# Patient Record
Sex: Female | Born: 1965 | Race: White | Hispanic: No | Marital: Married | Smoking: Never smoker
Health system: Southern US, Community
[De-identification: ages and names within clinical notes are randomized; demographics above are authoritative.]

## PROBLEM LIST (undated history)

## (undated) DIAGNOSIS — I10 Essential (primary) hypertension: Secondary | ICD-10-CM

## (undated) DIAGNOSIS — E538 Deficiency of other specified B group vitamins: Secondary | ICD-10-CM

## (undated) DIAGNOSIS — D649 Anemia, unspecified: Secondary | ICD-10-CM

## (undated) HISTORY — PX: SIGMOIDOSCOPY: SHX6686

## (undated) HISTORY — DX: Deficiency of other specified B group vitamins: E53.8

## (undated) HISTORY — PX: ESOPHAGOGASTRODUODENOSCOPY: SHX1529

## (undated) HISTORY — PX: NO PAST SURGERIES: SHX2092

---

## 2007-06-15 ENCOUNTER — Ambulatory Visit: Payer: Self-pay | Admitting: Obstetrics and Gynecology

## 2008-06-21 ENCOUNTER — Ambulatory Visit: Payer: Self-pay | Admitting: Obstetrics and Gynecology

## 2008-07-09 ENCOUNTER — Ambulatory Visit: Payer: Self-pay | Admitting: Obstetrics and Gynecology

## 2009-06-24 ENCOUNTER — Ambulatory Visit: Payer: Self-pay | Admitting: Obstetrics and Gynecology

## 2009-07-29 ENCOUNTER — Ambulatory Visit: Payer: Self-pay | Admitting: Gastroenterology

## 2010-06-26 ENCOUNTER — Ambulatory Visit: Payer: Self-pay | Admitting: Obstetrics and Gynecology

## 2010-07-09 ENCOUNTER — Ambulatory Visit: Payer: Self-pay | Admitting: Obstetrics and Gynecology

## 2010-12-17 ENCOUNTER — Ambulatory Visit: Payer: Self-pay | Admitting: Internal Medicine

## 2010-12-29 ENCOUNTER — Ambulatory Visit: Payer: Self-pay | Admitting: Internal Medicine

## 2011-01-23 ENCOUNTER — Encounter: Payer: Self-pay | Admitting: Internal Medicine

## 2011-04-15 ENCOUNTER — Ambulatory Visit: Payer: Self-pay | Admitting: Sports Medicine

## 2011-06-29 ENCOUNTER — Ambulatory Visit: Payer: Self-pay | Admitting: Obstetrics and Gynecology

## 2011-08-14 ENCOUNTER — Other Ambulatory Visit: Payer: Self-pay | Admitting: Internal Medicine

## 2011-08-14 NOTE — Telephone Encounter (Signed)
[phone] Pt called to check on her bp meds.   [facility] in [place]  Has been faxing over request and will fax again today Pt is completely out of meds for 4 days   Her bp 200/90 today

## 2011-08-14 NOTE — Telephone Encounter (Signed)
Patient is requesting a refill of Bisoprolol-HCTZ 5/6.25, two tabs daily.  [facility] [address] [place] . Is this okay to fill?

## 2011-08-21 MED ORDER — BISOPROLOL-HYDROCHLOROTHIAZIDE 5-6.25 MG PO TABS: ORAL_TABLET | ORAL | Status: DC

## 2011-10-22 LAB — HM DIABETES EYE EXAM

## 2012-03-17 ENCOUNTER — Ambulatory Visit (INDEPENDENT_AMBULATORY_CARE_PROVIDER_SITE_OTHER): Payer: PRIVATE HEALTH INSURANCE | Admitting: Internal Medicine

## 2012-03-17 ENCOUNTER — Ambulatory Visit (INDEPENDENT_AMBULATORY_CARE_PROVIDER_SITE_OTHER)
Admission: RE | Admit: 2012-03-17 | Discharge: 2012-03-17 | Disposition: A | Discharge status: Home / Self Care | Payer: PRIVATE HEALTH INSURANCE | Source: Ambulatory Visit | Attending: Internal Medicine | Admitting: Internal Medicine

## 2012-03-17 ENCOUNTER — Encounter: Payer: Self-pay | Admitting: Internal Medicine

## 2012-03-17 VITALS — BP 128/78 | HR 70 | Temp 98.7°F | Resp 16 | Ht 63.0 in | Wt 149.2 lb

## 2012-03-17 DIAGNOSIS — I10 Essential (primary) hypertension: Secondary | ICD-10-CM

## 2012-03-17 DIAGNOSIS — Z79899 Other long term (current) drug therapy: Secondary | ICD-10-CM

## 2012-03-17 DIAGNOSIS — R0601 Orthopnea: Secondary | ICD-10-CM

## 2012-03-17 DIAGNOSIS — M25559 Pain in unspecified hip: Secondary | ICD-10-CM

## 2012-03-17 DIAGNOSIS — Z13220 Encounter for screening for lipoid disorders: Secondary | ICD-10-CM

## 2012-03-17 DIAGNOSIS — M25551 Pain in right hip: Secondary | ICD-10-CM

## 2012-03-17 DIAGNOSIS — R0609 Other forms of dyspnea: Secondary | ICD-10-CM

## 2012-03-17 LAB — COMPREHENSIVE METABOLIC PANEL
AST: 12 U/L (ref 0–37)
BUN: 7 mg/dL (ref 6–23)
Calcium: 9.3 mg/dL (ref 8.4–10.5)
Chloride: 101 mEq/L (ref 96–112)
Creatinine, Ser: 0.7 mg/dL (ref 0.4–1.2)
Glucose, Bld: 113 mg/dL — ABNORMAL HIGH (ref 70–99)

## 2012-03-17 LAB — LIPID PANEL
Cholesterol: 154 mg/dL (ref 0–200)
LDL Cholesterol: 47 mg/dL (ref 0–99)
Triglycerides: 97 mg/dL (ref 0.0–149.0)
VLDL: 19.4 mg/dL (ref 0.0–40.0)

## 2012-03-17 MED ORDER — BISOPROLOL-HYDROCHLOROTHIAZIDE 5-6.25 MG PO TABS: ORAL_TABLET | ORAL | Status: DC

## 2012-03-17 MED ORDER — HYDROCODONE-ACETAMINOPHEN 10-325 MG PO TABS: 1.0000 | ORAL_TABLET | Freq: Three times a day (TID) | ORAL | Status: DC | PRN

## 2012-03-17 MED ORDER — CYANOCOBALAMIN 1000 MCG/ML IJ SOLN: 1000.0000 ug | INTRAMUSCULAR | Status: DC

## 2012-03-17 NOTE — Progress Notes (Signed)
Patient ID: Nicole Jacobs, female   DOB: [birth year], 46 y.o.   MRN: [id number]    Patient Active Problem List  Diagnosis  . Hip pain, right  . Hypertension    Subjective:  CC:   Chief Complaint  Patient presents with  . Medication Refill    HPI:   Nicole Jacobs is a 46 y.o. female who presents as a new patient to establish primary care with the chief complaint og right sided hip pain.  Had a myelogram by Dr Zachery Dauer, last August which revealed tendonitis and two tears in the muscleand labrum of  the right hip.  She has avoided going back to him because he said she would ultimately need surgery.  Does not want to consider surgery because of her job responsibilities and fear of losing job .  Her pain has returned due to a recent slip while playing with her daughter at the swimming pool.  Pain starts in the right groin, wraps around the hip to her right ischial tuberosity. She has been taking  tylenol and alleve several times daily for several weeks. 2) Progressive dyspnea with exertion.  She has noted increased SOB over the past several months,  NO cough or PND or chest pain .  Not physically active and has gained weight because of her chronic hip pain .    No past medical history on file.  No past surgical history on file.  No family history on file.  History   Social History  . Marital Status: Married    Spouse Name: N/A    Number of Children: N/A  . Years of Education: N/A   Occupational History  . Not on file.   Social History Main Topics  . Smoking status: Never Smoker   . Smokeless tobacco: Never Used  . Alcohol Use: Yes  . Drug Use: No  . Sexually Active: Not on file   Other Topics Concern  . Not on file   Social History Narrative  . No narrative on file         @ALLHX @    Review of Systems:   The remainder of the review of systems was negative except those addressed in the HPI.       Objective:  BP 128/78  Pulse 70  Temp 98.7 F (37.1 C)  (Oral)  Resp 16  Ht 5\' 3"  (1.6 m)  Wt 149 lb 4 oz (67.699 kg)  BMI 26.44 kg/m2  SpO2 97%  LMP 03/13/2012  General appearance: alert, cooperative and appears stated age Ears: normal TM's and external ear canals both ears Throat: lips, mucosa, and tongue normal; teeth and gums normal Neck: no adenopathy, no carotid bruit, supple, symmetrical, trachea midline and thyroid not enlarged, symmetric, no tenderness/mass/nodules Back: symmetric, no curvature. ROM normal. No CVA tenderness. Lungs: clear to auscultation bilaterally Heart: regular rate and rhythm, S1, S2 normal, no murmur, click, rub or gallop Abdomen: soft, non-tender; bowel sounds normal; no masses,  no organomegaly Pulses: 2+ and symmetric Skin: Skin color, texture, turgor normal. No rashes or lesions Lymph nodes: Cervical, supraclavicular, and axillary nodes normal.  Assessment and Plan:  Hip pain, right Recurrent, with prior Sports medicine evaluation leading to MR arthrogram of hip Secondary to partial tear of the gluteus medius tendon, tendonitis of the hamstrings,  And subtle tear of the superior posterior aspect of the acetabular labrum,  By MRI of right hip done with fluoro arthrogram August 2012.  She has re injured the area after a  mild fall recently.  She has been advised to follow up with Richardean Canal.  Vicodin given for pain control.   Hypertension Well controlled on current regimen. Normal fasting lipids and renal function.    Updated Medication List Outpatient Encounter Prescriptions as of 03/17/2012  Medication Sig Dispense Refill  . bisoprolol-hydrochlorothiazide (ZIAC) 5-6.25 MG per tablet Take two tablets by mouth daily  60 tablet  4  . cyanocobalamin (,VITAMIN B-12,) 1000 MCG/ML injection Inject 1 mL (1,000 mcg total) into the muscle every 30 (thirty) days.  10 mL  1  . DISCONTD: bisoprolol-hydrochlorothiazide (ZIAC) 5-6.25 MG per tablet Take two tablets by mouth daily  60 tablet  4  . DISCONTD:  cyanocobalamin (,VITAMIN B-12,) 1000 MCG/ML injection Inject 1,000 mcg into the muscle every 30 (thirty) days.      Marland Kitchen HYDROcodone-acetaminophen (NORCO) 10-325 MG per tablet Take 1 tablet by mouth every 8 (eight) hours as needed for pain.  90 tablet  1     Orders Placed This Encounter  Procedures  . HM MAMMOGRAPHY  . DG Chest 2 View  . HM MAMMOGRAPHY  . HM PAP SMEAR  . Lipid panel  . Comprehensive metabolic panel    No Follow-up on file.

## 2012-03-17 NOTE — Patient Instructions (Addendum)
You can use tylenol (acetominophen) with either Alleve or Advil.     I will prescribe vicodin , short term,   For one month  For severe pain   The maximum daily amount  Of acetominophen is 2000 mg daily ( to prevent liver injury)   x ray at [facility]  follow up depending  on results  Need Dr Tommas Olp notes from 2012

## 2012-03-19 DIAGNOSIS — I10 Essential (primary) hypertension: Secondary | ICD-10-CM | POA: Insufficient documentation

## 2012-03-19 DIAGNOSIS — M25551 Pain in right hip: Secondary | ICD-10-CM | POA: Insufficient documentation

## 2012-03-19 NOTE — Assessment & Plan Note (Addendum)
Well controlled on current regimen. Normal fasting lipids and renal function.

## 2012-03-19 NOTE — Assessment & Plan Note (Signed)
Recurrent, with prior Sports medicine evaluation leading to MR arthrogram of hip Secondary to partial tear of the gluteus medius tendon, tendonitis of the hamstrings,  And subtle tear of the superior posterior aspect of the acetabular labrum,  By MRI of right hip done with fluoro arthrogram August 2012.  She has re injured the area after a mild fall recently.  She has been advised to follow up with Richardean Canal.  Vicodin given for pain control.

## 2012-03-30 ENCOUNTER — Encounter: Payer: Self-pay | Admitting: Internal Medicine

## 2012-04-27 ENCOUNTER — Other Ambulatory Visit: Payer: Self-pay | Admitting: *Deleted

## 2012-04-27 MED ORDER — "SYRINGE/NEEDLE (DISP) 25G X 1"" 3 ML MISC": Status: DC

## 2012-05-30 ENCOUNTER — Telehealth: Payer: Self-pay | Admitting: Internal Medicine

## 2012-05-30 NOTE — Telephone Encounter (Signed)
Pt is calling concerning Rx for Syringes for her B-12's. She is saying that the needles were to big. She was wondering if smaller needles could be called in. She uses [facility]. She is also needing refill for Vicodin for her hip pain.

## 2012-06-02 NOTE — Telephone Encounter (Signed)
Refill request for Vicodin, q 8 hours Last filled- 03/17/12, #90 x 1 Last seen- 03/17/12 Follow up - not noted Please advise refills and I will call pt regarding B12 needles.

## 2012-06-16 ENCOUNTER — Other Ambulatory Visit: Payer: Self-pay

## 2012-06-16 MED ORDER — "SYRINGE/NEEDLE (DISP) 25G X 1"" 3 ML MISC": Status: DC

## 2012-06-16 MED ORDER — HYDROCODONE-ACETAMINOPHEN 10-325 MG PO TABS: 1.0000 | ORAL_TABLET | Freq: Three times a day (TID) | ORAL | Status: DC | PRN

## 2012-06-16 MED ORDER — HYDROCODONE-ACETAMINOPHEN 10-325 MG PO TABS: 1.0000 | ORAL_TABLET | Freq: Three times a day (TID) | ORAL | Status: AC | PRN

## 2012-06-16 NOTE — Telephone Encounter (Signed)
Ok to refill the vicodin  #90   X 1   And b12 needles

## 2012-06-16 NOTE — Telephone Encounter (Signed)
Refill request for B-12 #12 and Hydrocodone Acetaminophen 10-325 #90 1 R sent to [facility].

## 2012-06-29 ENCOUNTER — Ambulatory Visit: Payer: Self-pay | Admitting: Obstetrics and Gynecology

## 2012-10-20 ENCOUNTER — Other Ambulatory Visit: Payer: Self-pay | Admitting: Internal Medicine

## 2012-11-30 ENCOUNTER — Other Ambulatory Visit: Payer: Self-pay | Admitting: Internal Medicine

## 2012-12-12 ENCOUNTER — Encounter: Payer: Self-pay | Admitting: *Deleted

## 2012-12-12 ENCOUNTER — Encounter: Payer: Self-pay | Admitting: Internal Medicine

## 2012-12-12 ENCOUNTER — Ambulatory Visit (INDEPENDENT_AMBULATORY_CARE_PROVIDER_SITE_OTHER): Payer: PRIVATE HEALTH INSURANCE | Admitting: Internal Medicine

## 2012-12-12 VITALS — BP 116/68 | HR 74 | Temp 97.9°F | Resp 16 | Wt 133.5 lb

## 2012-12-12 DIAGNOSIS — M25551 Pain in right hip: Secondary | ICD-10-CM

## 2012-12-12 DIAGNOSIS — I10 Essential (primary) hypertension: Secondary | ICD-10-CM

## 2012-12-12 DIAGNOSIS — G8929 Other chronic pain: Secondary | ICD-10-CM

## 2012-12-12 DIAGNOSIS — M25559 Pain in unspecified hip: Secondary | ICD-10-CM

## 2012-12-12 DIAGNOSIS — Z79899 Other long term (current) drug therapy: Secondary | ICD-10-CM

## 2012-12-12 LAB — COMPREHENSIVE METABOLIC PANEL
ALT: 12 U/L (ref 0–35)
Albumin: 3.6 g/dL (ref 3.5–5.2)
CO2: 29 mEq/L (ref 19–32)
GFR: 77.75 mL/min (ref >60.00)
Glucose, Bld: 128 mg/dL — ABNORMAL HIGH (ref 70–99)
Potassium: 3.5 mEq/L (ref 3.5–5.1)
Sodium: 135 mEq/L (ref 135–145)
Total Bilirubin: 0.6 mg/dL (ref 0.3–1.2)
Total Protein: 7.5 g/dL (ref 6.0–8.3)

## 2012-12-12 MED ORDER — CYANOCOBALAMIN 1000 MCG/ML IJ SOLN: 1000.0000 ug | INTRAMUSCULAR | Status: DC

## 2012-12-12 MED ORDER — HYDROCODONE-ACETAMINOPHEN 10-325 MG PO TABS: 1.0000 | ORAL_TABLET | Freq: Four times a day (QID) | ORAL | Status: DC | PRN

## 2012-12-12 MED ORDER — BISOPROLOL-HYDROCHLOROTHIAZIDE 5-6.25 MG PO TABS: ORAL_TABLET | ORAL | Status: DC

## 2012-12-12 NOTE — Assessment & Plan Note (Signed)
secondary to gluteus medius tear and labral tear.  Discussed at length the need for surgical evaluation to prevent permanent disability.

## 2012-12-12 NOTE — Patient Instructions (Addendum)
I am referring you to Dr Shela Nevin at [facility] to discuss your hip   Refill on the vicodin   Try using colace stool softener on a daily basis  Return in July for your annual physical with fasting labs on the same morning to save trips

## 2012-12-12 NOTE — Progress Notes (Signed)
Patient ID: Nicole Jacobs, female   DOB: [birth year], 47 y.o.   MRN: [id number]  Patient Active Problem List  Diagnosis  . Hip pain, right  . Hypertension    Subjective:  CC:   Chief Complaint  Patient presents with  . Follow-up    Refill meds    HPI:   Dalyce Robbinsis a 47 y.o. female who presents  History reviewed. No pertinent past medical history.  History reviewed. No pertinent past surgical history.     The following portions of the patient's history were reviewed and updated as appropriate: Allergies, current medications, and problem list.    Review of Systems:   12 Pt  review of systems was negative except those addressed in the HPI,     History   Social History  . Marital Status: Married    Spouse Name: N/A    Number of Children: N/A  . Years of Education: N/A   Occupational History  . Not on file.   Social History Main Topics  . Smoking status: Never Smoker   . Smokeless tobacco: Never Used  . Alcohol Use: Yes  . Drug Use: No  . Sexually Active: Not on file   Other Topics Concern  . Not on file   Social History Narrative  . No narrative on file    Objective:  BP 116/68  Pulse 74  Temp(Src) 97.9 F (36.6 C) (Oral)  Resp 16  Wt 133 lb 8 oz (60.555 kg)  BMI 23.65 kg/m2  SpO2 98%  LMP 11/07/2012  General appearance: alert, cooperative and appears stated age Back: symmetric, no curvature. ROM normal. No CVA tenderness. Lungs: clear to auscultation bilaterally Heart: regular rate and rhythm, S1, S2 normal, no murmur, click, rub or gallop Abdomen: soft, non-tender; bowel sounds normal; no masses,  no organomegaly Pulses: 2+ and symmetric Skin: Skin color, texture, turgor normal. No rashes or lesions Lymph nodes: Cervical, supraclavicular, and axillary nodes normal. MSK: decreased ROM right hip secondary to pain ,  Hip flexion 4/5.   Assessment and Plan:  Hip pain, right secondary to gluteus medius tear and labral tear.   Discussed at length the need for surgical evaluation to prevent permanent disability.   Hypertension Well controlled on current regimen. Renal function stable, no changes today.   Updated Medication List Outpatient Encounter Prescriptions as of 12/12/2012  Medication Sig Dispense Refill  . bisoprolol-hydrochlorothiazide (ZIAC) 5-6.25 MG per tablet TAKE 2 TABLETS BY MOUTH DAILY  60 tablet  6  . cyanocobalamin (,VITAMIN B-12,) 1000 MCG/ML injection Inject 1 mL (1,000 mcg total) into the muscle every 30 (thirty) days.  10 mL  1  . HYDROcodone-acetaminophen (NORCO) 10-325 MG per tablet Take 1 tablet by mouth every 6 (six) hours as needed for pain.  90 tablet  3  . SYRINGE-NEEDLE, DISP, 3 ML (BD ECLIPSE SYRINGE) 25G X 1" 3 ML MISC Use to inject b12 every 30 days  12 each  2  . [DISCONTINUED] bisoprolol-hydrochlorothiazide (ZIAC) 5-6.25 MG per tablet TAKE 2 TABLETS BY MOUTH DAILY  60 tablet  0  . [DISCONTINUED] cyanocobalamin (,VITAMIN B-12,) 1000 MCG/ML injection Inject 1 mL (1,000 mcg total) into the muscle every 30 (thirty) days.  10 mL  1  . [DISCONTINUED] HYDROcodone-acetaminophen (NORCO) 10-325 MG per tablet Take 1 tablet by mouth every 6 (six) hours as needed for pain.       No facility-administered encounter medications on file as of 12/12/2012.     Orders Placed This Encounter  Procedures  .  Comprehensive metabolic panel  . Ambulatory referral to Orthopedic Surgery    No Follow-up on file.

## 2012-12-12 NOTE — Assessment & Plan Note (Signed)
Well controlled on current regimen. Renal function stable, no changes today. 

## 2013-12-11 ENCOUNTER — Encounter: Payer: Self-pay | Admitting: Internal Medicine

## 2013-12-11 ENCOUNTER — Encounter (INDEPENDENT_AMBULATORY_CARE_PROVIDER_SITE_OTHER): Payer: Self-pay

## 2013-12-11 ENCOUNTER — Ambulatory Visit (INDEPENDENT_AMBULATORY_CARE_PROVIDER_SITE_OTHER): Payer: Medicaid Other | Admitting: Internal Medicine

## 2013-12-11 VITALS — BP 142/74 | HR 84 | Temp 98.4°F | Resp 16 | Ht 62.0 in | Wt 116.0 lb

## 2013-12-11 DIAGNOSIS — R5381 Other malaise: Secondary | ICD-10-CM

## 2013-12-11 DIAGNOSIS — M25552 Pain in left hip: Secondary | ICD-10-CM

## 2013-12-11 DIAGNOSIS — R5383 Other fatigue: Secondary | ICD-10-CM

## 2013-12-11 DIAGNOSIS — M545 Low back pain, unspecified: Secondary | ICD-10-CM

## 2013-12-11 DIAGNOSIS — I10 Essential (primary) hypertension: Secondary | ICD-10-CM

## 2013-12-11 DIAGNOSIS — F5105 Insomnia due to other mental disorder: Secondary | ICD-10-CM

## 2013-12-11 DIAGNOSIS — M25559 Pain in unspecified hip: Secondary | ICD-10-CM

## 2013-12-11 DIAGNOSIS — M5430 Sciatica, unspecified side: Secondary | ICD-10-CM

## 2013-12-11 DIAGNOSIS — F411 Generalized anxiety disorder: Secondary | ICD-10-CM

## 2013-12-11 DIAGNOSIS — F489 Nonpsychotic mental disorder, unspecified: Secondary | ICD-10-CM

## 2013-12-11 DIAGNOSIS — F419 Anxiety disorder, unspecified: Secondary | ICD-10-CM

## 2013-12-11 DIAGNOSIS — M25551 Pain in right hip: Secondary | ICD-10-CM

## 2013-12-11 DIAGNOSIS — R634 Abnormal weight loss: Secondary | ICD-10-CM

## 2013-12-11 LAB — VITAMIN B12: Vitamin B-12: 260 pg/mL (ref 211–911)

## 2013-12-11 LAB — CBC WITH DIFFERENTIAL/PLATELET
BASOS PCT: 0.9 % (ref 0.0–3.0)
Basophils Absolute: 0 10*3/uL (ref 0.0–0.1)
EOS ABS: 0.1 10*3/uL (ref 0.0–0.7)
EOS PCT: 2.1 % (ref 0.0–5.0)
HCT: 37.5 % (ref 36.0–46.0)
HEMOGLOBIN: 12.6 g/dL (ref 12.0–15.0)
LYMPHS PCT: 44.5 % (ref 12.0–46.0)
Lymphs Abs: 2.2 10*3/uL (ref 0.7–4.0)
MCHC: 33.7 g/dL (ref 30.0–36.0)
MCV: 89.7 fl (ref 78.0–100.0)
MONOS PCT: 9.7 % (ref 3.0–12.0)
Monocytes Absolute: 0.5 10*3/uL (ref 0.1–1.0)
NEUTROS ABS: 2.1 10*3/uL (ref 1.4–7.7)
Neutrophils Relative %: 42.8 % — ABNORMAL LOW (ref 43.0–77.0)
Platelets: 325 10*3/uL (ref 150.0–400.0)
RBC: 4.18 Mil/uL (ref 3.87–5.11)
RDW: 12.9 % (ref 11.5–14.6)
WBC: 4.9 10*3/uL (ref 4.5–10.5)

## 2013-12-11 LAB — COMPREHENSIVE METABOLIC PANEL
ALBUMIN: 3.9 g/dL (ref 3.5–5.2)
ALT: 14 U/L (ref 0–35)
AST: 15 U/L (ref 0–37)
Alkaline Phosphatase: 59 U/L (ref 39–117)
BUN: 14 mg/dL (ref 6–23)
CALCIUM: 9.3 mg/dL (ref 8.4–10.5)
CHLORIDE: 104 meq/L (ref 96–112)
CO2: 28 meq/L (ref 19–32)
Creatinine, Ser: 0.7 mg/dL (ref 0.4–1.2)
GFR: 97.14 mL/min (ref >60.00)
Glucose, Bld: 83 mg/dL (ref 70–99)
POTASSIUM: 4.1 meq/L (ref 3.5–5.1)
SODIUM: 140 meq/L (ref 135–145)
TOTAL PROTEIN: 6.7 g/dL (ref 6.0–8.3)
Total Bilirubin: 0.9 mg/dL (ref 0.3–1.2)

## 2013-12-11 LAB — TSH: TSH: 0.84 u[IU]/mL (ref 0.35–5.50)

## 2013-12-11 LAB — MAGNESIUM: Magnesium: 1.9 mg/dL (ref 1.5–2.5)

## 2013-12-11 MED ORDER — TRAMADOL HCL 50 MG PO TABS: 50.0000 mg | ORAL_TABLET | Freq: Three times a day (TID) | ORAL | Status: DC | PRN

## 2013-12-11 MED ORDER — "SYRINGE/NEEDLE (DISP) 25G X 1"" 3 ML MISC": Status: DC

## 2013-12-11 MED ORDER — BISOPROLOL-HYDROCHLOROTHIAZIDE 5-6.25 MG PO TABS: ORAL_TABLET | ORAL | Status: DC

## 2013-12-11 MED ORDER — TRAZODONE HCL 50 MG PO TABS: 25.0000 mg | ORAL_TABLET | Freq: Every evening | ORAL | Status: DC | PRN

## 2013-12-11 MED ORDER — CYANOCOBALAMIN 1000 MCG/ML IJ SOLN: 1000.0000 ug | INTRAMUSCULAR | Status: DC

## 2013-12-11 NOTE — Progress Notes (Signed)
Pre-visit discussion using our clinic review tool. No additional management support is needed unless otherwise documented below in the visit note.  

## 2013-12-11 NOTE — Progress Notes (Signed)
Patient ID: Nicole Jacobs, female   DOB: 13-Dec-1965, 48 y.o.   MRN: [id number]   Patient Active Problem List   Diagnosis Date Noted  . Loss of weight 12/12/2013  . 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 Lumbago 12/12/2013  . Hip pain, right 03/19/2012  . Hypertension 03/19/2012    Subjective:  CC:   Chief Complaint  Patient presents with  . Follow-up    Medication refills    HPI:   Nicole Jacobs is a 48 y.o. female who presents for Follow up on hypertension and hip pain.    Last seen one year ago.  IN the interim her mother died of lung Ca in 06-15-2023 after being diagnosed in June.  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 working part time currently. Sleep latency of  30 to 45 min, excessive worrying,    Has had an unintentional wt loss of 33 lbs since July 2013.  Cites stress as trigger  Denies abd pain ,  No change in stools.   Screening : last mammo 2014,  PAP smears annually by [facility]) bc of abnormal ones.  No history of black stools,  Had BRBPR in the past and sigmoidoscopy was done by [facility] last year. .    Both hips hurting now both day and night.  Aching pain,  Not severe.  Taking Goody's powders on a prn basis averaging several times daily along with advil or alleve.   Both legs,.  Right hip was x rayed a while ago at [facility] and MRI, was normal.  Had a tear in the labrum.  Surgery was recommended but she couldn't be out of work.  Now she is receiving Medicaid and not sure she could afford it.   Thinks she may have firbomyalgia,  Stays tired all the time   Ran out of B12 last month,  Not taking any oral supplements.  History: Persistent rip  hip pain since August 2012.  He was seen by Dr. Zachery Dauer in 2012 and sent for PT but  had an  MRI repeated with contrast due to leg continually giving way  ,  It showed two tears  On  the the labrum and one in the gluteus medius muscle.  She deferred surgical evaluation because of work issues.  She cannot sleep on right side,  And her pain is aggravated by walking.  She feels that she may have reinjured it last year during a minor fall . The original injury occurred at work but it was after hours  And caused by her slipping on the wet floor and doing a split.  She did not report it because she wasn't supposed to be working at night .     No past medical history on file.  No past surgical history on file.     The following portions of the patient's history were reviewed and updated as appropriate: Allergies, current medications, and problem list.    Review of Systems:   Patient denies headache, fevers, malaise, unintentional weight loss, skin rash, eye pain, sinus congestion and sinus pain, sore throat, dysphagia,  hemoptysis , cough, dyspnea, wheezing, chest pain, palpitations, orthopnea, edema, abdominal pain, nausea, melena, diarrhea, constipation, flank pain, dysuria, hematuria, urinary  Frequency, nocturia, numbness, tingling, seizures,  Focal weakness, Loss of consciousness,  Tremor, insomnia, depression, anxiety, and suicidal ideation.  History   Social History  . Marital Status: Married    Spouse Name: N/A    Number of Children: N/A  . Years of Education: N/A   Occupational History  . Not on file.   Social History Main Topics  . Smoking status: Never Smoker   . Smokeless tobacco: Never Used  . Alcohol Use: Yes  . Drug Use: No  . Sexual Activity: Not on file   Other Topics Concern  . Not on file   Social History Narrative  . No narrative on file    Objective:  Filed Vitals:   12/11/13 0838  BP: 142/74  Pulse: 84  Temp: 98.4 F (36.9 C)  Resp: 16     General appearance: alert, cooperative and appears stated age Ears: normal TM's and external ear canals both ears Throat: lips, mucosa, and tongue normal; teeth and gums normal Neck:  no adenopathy, no carotid bruit, supple, symmetrical, trachea midline and thyroid not enlarged, symmetric, no tenderness/mass/nodules Back: symmetric, no curvature. ROM normal. No CVA tenderness. Lungs: clear to auscultation bilaterally Heart: regular rate and rhythm, S1, S2 normal, no murmur, click, rub or gallop Abdomen: soft, non-tender; bowel sounds normal; no masses,  no organomegaly Pulses: 2+ and symmetric Skin: Skin color, texture, turgor normal. No rashes or lesions Lymph nodes: Cervical, supraclavicular, and axillary nodes normal. MSK: no vertebral tenderness.  Hip ROM limited by pain   Assessment and Plan:  Loss of weight Ruling out thyroid and malignancy before attributing to stress.  Records requested of [facility] by [facility].  Needs to do FOBT  Hip pain, right secondary to gluteus medius tear and labral tea diagnosed by repeat MRI in 2014 after eval by Richard[facility] Canal[facility]. Now having bilateral hip pain .   X rays of left hip and lower back ordered.  Advised to stop using Goody's powders due ot risk of GI bleed. Rx for tramadol given and aleve. Discussed at length the need for surgical evaluation to prevent permanent disability.     Insomnia secondary to anxiety Trial of trazodone  Lumbago Patient previously taking vicodin in 2012,  MRI was normal of lumbar spine.  Will avoid narcotics for management of pain and refer to Pain clinic if nonsurgical treatment indicated.   Hypertension Resume prior medication.  Renal function normal.  Lab Results  Component Value Date   CREATININE 0.7 12/11/2013   Lab Results  Component Value Date   NA 140 12/11/2013   K 4.1 12/11/2013   CL 104 12/11/2013   CO2 28 12/11/2013    A total of 40 minutes was spent with patient more than half of which was spent in counseling, reviewing records from other prviders and coordination of care.   Updated Medication List Outpatient Encounter Prescriptions as of 12/11/2013  Medication Sig  .  bisoprolol-hydrochlorothiazide (ZIAC) 5-6.25 MG per tablet TAKE 2 TABLETS BY MOUTH DAILY  . cyanocobalamin (,VITAMIN B-12,) 1000 MCG/ML injection Inject 1 mL (1,000 mcg total) into the muscle every 30 (thirty) days.  Marland Kitchen. HYDROcodone-acetaminophen (NORCO) 10-325 MG per tablet Take 1 tablet by mouth every 6 (six) hours as needed for pain.  Marland Kitchen. SYRINGE-NEEDLE, DISP, 3 ML (BD ECLIPSE SYRINGE) 25G X 1" 3 ML MISC Use to inject b12 every 30 days  . [DISCONTINUED] bisoprolol-hydrochlorothiazide (ZIAC) 5-6.25 MG per tablet TAKE 2 TABLETS BY MOUTH DAILY  . [DISCONTINUED] cyanocobalamin (,VITAMIN B-12,) 1000 MCG/ML injection Inject 1 mL (1,000 mcg total) into the muscle every 30 (thirty) days.  . [DISCONTINUED] SYRINGE-NEEDLE,  DISP, 3 ML (BD ECLIPSE SYRINGE) 25G X 1" 3 ML MISC Use to inject b12 every 30 days  . traMADol (ULTRAM) 50 MG tablet Take 1 tablet (50 mg total) by mouth every 8 (eight) hours as needed.  . traZODone (DESYREL) 50 MG tablet Take 0.5-1 tablets (25-50 mg total) by mouth at bedtime as needed for sleep.     Orders Placed This Encounter  Procedures  . Fecal occult blood, imunochemical  . DG Hip Complete Left  . DG Lumbar Spine Complete  . CBC with Differential  . Folate RBC  . Vitamin B12  . TSH  . Comprehensive metabolic panel  . Magnesium    Return in about 4 weeks (around 01/08/2014).

## 2013-12-11 NOTE — Patient Instructions (Signed)
Your anxiety and insomnia need treatment.  I am starting you on trazodone to take at night  Your daily use of Goody's powders puts you at high risk  For a bleeding gastric ulcer  Please stop using it and use tramadol every 6 hours as needed for pain,  Along with aleve twice daily  [facility] ryas [facility]  left hip and lower back [facility]   Return in one month

## 2013-12-12 DIAGNOSIS — F5105 Insomnia due to other mental disorder: Secondary | ICD-10-CM

## 2013-12-12 DIAGNOSIS — F419 Anxiety disorder, unspecified: Secondary | ICD-10-CM | POA: Insufficient documentation

## 2013-12-12 DIAGNOSIS — M545 Low back pain, unspecified: Secondary | ICD-10-CM

## 2013-12-12 DIAGNOSIS — R634 Abnormal weight loss: Secondary | ICD-10-CM | POA: Insufficient documentation

## 2013-12-12 HISTORY — DX: Low back pain, unspecified: M54.50

## 2013-12-12 LAB — FOLATE RBC: RBC Folate: 521 ng/mL (ref >280)

## 2013-12-12 NOTE — Assessment & Plan Note (Addendum)
Resume prior medication.  Renal function normal.  Lab Results  Component Value Date   CREATININE 0.7 12/11/2013   Lab Results  Component Value Date   NA 140 12/11/2013   K 4.1 12/11/2013   CL 104 12/11/2013   CO2 28 12/11/2013

## 2013-12-12 NOTE — Assessment & Plan Note (Signed)
Ruling out thyroid and malignancy before attributing to stress.  Records requested of GI evaluation by [facility].  Needs to do FOBT

## 2013-12-12 NOTE — Assessment & Plan Note (Signed)
Trial of trazodone. 

## 2013-12-12 NOTE — Assessment & Plan Note (Signed)
Patient previously taking vicodin in 2012,  MRI was normal of lumbar spine.  Will avoid narcotics for management of pain and refer to Pain clinic if nonsurgical treatment indicated.

## 2013-12-12 NOTE — Assessment & Plan Note (Addendum)
secondary to gluteus medius tear and labral tea diagnosed by repeat MRI in 2014 after eval by [facility] Canal[facility]. Now having bilateral hip pain .   X rays of left hip and lower back ordered.  Advised to stop using Goody's powders due ot risk of GI bleed. Rx for tramadol given and aleve. Discussed at length the need for surgical evaluation to prevent permanent disability.

## 2013-12-13 ENCOUNTER — Encounter: Payer: Self-pay | Admitting: *Deleted

## 2013-12-15 ENCOUNTER — Ambulatory Visit: Payer: Self-pay | Admitting: Internal Medicine

## 2013-12-19 ENCOUNTER — Telehealth: Payer: Self-pay | Admitting: Internal Medicine

## 2013-12-19 NOTE — Telephone Encounter (Signed)
Her hip films are fine  lumbar spine films suggest degenerative disk disease

## 2013-12-20 ENCOUNTER — Other Ambulatory Visit (INDEPENDENT_AMBULATORY_CARE_PROVIDER_SITE_OTHER): Payer: Medicaid Other

## 2013-12-20 DIAGNOSIS — R634 Abnormal weight loss: Secondary | ICD-10-CM

## 2013-12-20 LAB — FECAL OCCULT BLOOD, IMMUNOCHEMICAL: Fecal Occult Bld: NEGATIVE

## 2013-12-20 NOTE — Telephone Encounter (Signed)
Left message for patient to return call.

## 2013-12-20 NOTE — Telephone Encounter (Signed)
Patient notified and voiced understanding.

## 2013-12-21 ENCOUNTER — Encounter: Payer: Self-pay | Admitting: *Deleted

## 2014-01-10 ENCOUNTER — Encounter: Payer: Self-pay | Admitting: Internal Medicine

## 2014-01-10 ENCOUNTER — Ambulatory Visit: Payer: Self-pay | Admitting: Internal Medicine

## 2014-01-10 ENCOUNTER — Ambulatory Visit (INDEPENDENT_AMBULATORY_CARE_PROVIDER_SITE_OTHER): Payer: Medicaid Other | Admitting: Internal Medicine

## 2014-01-10 ENCOUNTER — Encounter (INDEPENDENT_AMBULATORY_CARE_PROVIDER_SITE_OTHER): Payer: Self-pay

## 2014-01-10 VITALS — BP 114/60 | HR 89 | Temp 98.5°F | Resp 16 | Ht 62.0 in | Wt 116.0 lb

## 2014-01-10 DIAGNOSIS — M25551 Pain in right hip: Secondary | ICD-10-CM

## 2014-01-10 DIAGNOSIS — M25559 Pain in unspecified hip: Secondary | ICD-10-CM

## 2014-01-10 DIAGNOSIS — M545 Low back pain, unspecified: Secondary | ICD-10-CM

## 2014-01-10 DIAGNOSIS — M542 Cervicalgia: Secondary | ICD-10-CM

## 2014-01-10 DIAGNOSIS — M5432 Sciatica, left side: Secondary | ICD-10-CM

## 2014-01-10 DIAGNOSIS — M5430 Sciatica, unspecified side: Secondary | ICD-10-CM

## 2014-01-10 MED ORDER — PREDNISONE (PAK) 10 MG PO TABS: ORAL_TABLET | ORAL | Status: DC

## 2014-01-10 MED ORDER — HYDROCODONE-ACETAMINOPHEN 10-325 MG PO TABS: 1.0000 | ORAL_TABLET | Freq: Four times a day (QID) | ORAL | Status: DC | PRN

## 2014-01-10 NOTE — Progress Notes (Signed)
Patient ID: Nicole Jacobs, female   DOB: 01-Jun-1966, 48 y.o.   MRN: [id number]  Patient Active Problem List   Diagnosis Date Noted  . Neck pain on right side 01/11/2014  . Loss of weight 12/12/2013  . 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 Lumbago 12/12/2013  . Hip pain, right 03/19/2012  . Hypertension 03/19/2012    Subjective:  CC:   Chief Complaint  Patient presents with  . Follow-up    hip pain for left hip and for results    HPI:   Nicole Jacobs is a 48 y.o. female who presents for Follow up on chronic back and hip pain. Her left hip is now bothering her. Reports that it goes to sleep after sitting for a while.  Also reporting that her neck is popping,  Aches and the pain radiates to right shoulder.   Tramadol not helping with pain in hips.  Has not consented to referral to orthopedic surgeon yet. Still working part time.  Stocks shelves  No heavy lifting but repetitive lifting still part of her daily activities.   Recent laceration to forehead now resolved. Hit head on car door gashed forehead,  Big gooseegg and bruising,  Now resolved.    No past medical history on file.  No past surgical history on file.     The following portions of the patient's history were reviewed and updated as appropriate: Allergies, current medications, and problem list.    Review of Systems:   Patient denies headache, fevers, malaise, unintentional weight loss, skin rash, eye pain, sinus congestion and sinus pain, sore throat, dysphagia,  hemoptysis , cough, dyspnea, wheezing, chest pain, palpitations, orthopnea, edema, abdominal pain, nausea, melena, diarrhea, constipation, flank pain, dysuria, hematuria, urinary  Frequency, nocturia, numbness, tingling, seizures,  Focal weakness, Loss of consciousness,  Tremor, insomnia, depression, anxiety, and suicidal ideation.     History   Social History  . Marital Status: Married    Spouse Name: N/A    Number of Children: N/A  .  Years of Education: N/A   Occupational History  . Not on file.   Social History Main Topics  . Smoking status: Never Smoker   . Smokeless tobacco: Never Used  . Alcohol Use: Yes  . Drug Use: No  . Sexual Activity: Not on file   Other Topics Concern  . Not on file   Social History Narrative  . No narrative on file    Objective:  Filed Vitals:   01/10/14 1441  BP: 114/60  Pulse: 89  Temp: 98.5 F (36.9 C)  Resp: 16     General appearance: alert, cooperative and appears stated age Ears: normal TM's and external ear canals both ears Throat: lips, mucosa, and tongue normal; teeth and gums normal Neck: no adenopathy, no carotid bruit, supple, symmetrical, trachea midline and thyroid not enlarged, symmetric, no tenderness/mass/nodules Back: symmetric, no curvature. ROM normal. No CVA tenderness. Lungs: clear to auscultation bilaterally Heart: regular rate and rhythm, S1, S2 normal, no murmur, click, rub or gallop Abdomen: soft, non-tender; bowel sounds normal; no masses,  no organomegaly Pulses: 2+ and symmetric Skin: Skin color, texture, turgor normal. No rashes or lesions Lymph nodes: Cervical, supraclavicular, and axillary nodes normal.  Assessment and Plan:  Neck pain on right side Suspect cervical  spine stenosis.  Neuro exam is normal.Predniosne taper trial.   No intervention recommended at this time.    Hip pain, right secondary to gluteus medius tear and labral tear diagnosed by repeat  MRI in 2014 after eval by Richardean CanalKen Barnes. Now having bilateral hip pain , not relieved with tramadol. .   Referral to orthopedics advised. Short term vicodin use.   Lumbago Now with radicular pine.  Mri lumbar spine ordered.  Prednisone taper and vicodin    Updated Medication List Outpatient Encounter Prescriptions as of 01/10/2014  Medication Sig  . bisoprolol-hydrochlorothiazide (ZIAC) 5-6.25 MG per tablet TAKE 2 TABLETS BY MOUTH DAILY  . cyanocobalamin (,VITAMIN B-12,) 1000  MCG/ML injection Inject 1 mL (1,000 mcg total) into the muscle every 30 (thirty) days.  . SYRINGE-NEEDLE, DISP, 3 ML (BD ECLIPSE SYRINGE) 25G X 1" 3 ML MISC Use to inject b12 every 30 days  . traZODone (DESYREL) 50 MG tablet Take 0.5-1 tablets (25-50 mg total) by mouth at bedtime as needed for sleep.  Marland Kitchen. HYDROcodone-acetaminophen (NORCO) 10-325 MG per tablet Take 1 tablet by mouth every 6 (six) hours as needed.  . predniSONE (STERAPRED UNI-PAK) 10 MG tablet 6 tablets on Day 1 , then reduce by 1 tablet daily until gone  . traMADol (ULTRAM) 50 MG tablet Take 1 tablet (50 mg total) by mouth every 8 (eight) hours as needed.  . [DISCONTINUED] HYDROcodone-acetaminophen (NORCO) 10-325 MG per tablet Take 1 tablet by mouth every 6 (six) hours as needed for pain.     Orders Placed This Encounter  Procedures  . MR Lumbar Spine Wo Contrast  . DG Cervical Spine Complete  . AMB referral to orthopedics    No Follow-up on file.

## 2014-01-10 NOTE — Progress Notes (Signed)
Pre-visit discussion using our clinic review tool. No additional management support is needed unless otherwise documented below in the visit note.  

## 2014-01-10 NOTE — Patient Instructions (Signed)
I am prescribing a prednisone taper for you to take for the next  6 days for inflammation of your neck  DO NOT TAKE GOODY'S POWDERS WHEN YOU ARE TAKING THE PREDNISONE  I have ordered an MRI of lumbar spine to investigate you left leg pain and numbness  You can use the vicodin instead of tramadol for pain but I will not continually refill because yo need to see a suegeon

## 2014-01-11 ENCOUNTER — Telehealth: Payer: Self-pay | Admitting: Internal Medicine

## 2014-01-11 DIAGNOSIS — M542 Cervicalgia: Secondary | ICD-10-CM | POA: Insufficient documentation

## 2014-01-11 NOTE — Telephone Encounter (Signed)
xrays of neck received .  Her Plain films may 2015 notes mild DD changes at C5-6 with mild left foraminal narrowing. This could develop into spinal stenosis with repeated abuse of neck and shoulders.

## 2014-01-11 NOTE — Telephone Encounter (Signed)
Patient notified

## 2014-01-13 NOTE — Assessment & Plan Note (Addendum)
Suspect cervical  spine stenosis.  Neuro exam is normal.Predniosne taper trial.   No intervention recommended at this time.

## 2014-01-13 NOTE — Assessment & Plan Note (Addendum)
secondary to gluteus medius tear and labral tear diagnosed by repeat MRI in 2014 after eval by Richardean Canal. Now having bilateral hip pain , not relieved with tramadol. .   Referral to orthopedics advised. Short term vicodin use.

## 2014-01-13 NOTE — Assessment & Plan Note (Signed)
Now with radicular pine.  Mri lumbar spine ordered.  Prednisone taper and vicodin

## 2014-01-16 ENCOUNTER — Encounter: Payer: Self-pay | Admitting: Internal Medicine

## 2014-01-22 ENCOUNTER — Telehealth: Payer: Self-pay | Admitting: Internal Medicine

## 2014-01-22 DIAGNOSIS — M545 Low back pain, unspecified: Secondary | ICD-10-CM

## 2014-01-22 NOTE — Assessment & Plan Note (Addendum)
Her insurance denied coverage for repeat  lumbar MRI .  If her pain does not improve with prednisoen She will have to accept referral to PT , which I have ordered

## 2014-01-22 NOTE — Telephone Encounter (Signed)
Referral already in place

## 2014-01-22 NOTE — Telephone Encounter (Signed)
Her insurance denied coverage for repeat  lumbar MRI .  If her pain does not improve with prednisoen She will have to accept referral to PT , which I have ordered  

## 2014-01-22 NOTE — Telephone Encounter (Signed)
Patient called in checking the status of the MRI I told her that her insurance had denied coverage for the repeat MRI. She states she does have pain and is willing to proceed with the referral for PT.

## 2014-01-24 ENCOUNTER — Encounter: Payer: Self-pay | Admitting: Internal Medicine

## 2015-01-04 ENCOUNTER — Other Ambulatory Visit: Payer: Self-pay | Admitting: Internal Medicine

## 2015-01-04 DIAGNOSIS — R5383 Other fatigue: Secondary | ICD-10-CM

## 2015-01-04 DIAGNOSIS — E538 Deficiency of other specified B group vitamins: Secondary | ICD-10-CM

## 2015-01-04 DIAGNOSIS — E785 Hyperlipidemia, unspecified: Secondary | ICD-10-CM

## 2015-01-04 NOTE — Telephone Encounter (Signed)
Last OV 01/10/14 ok to fill?

## 2015-01-04 NOTE — Telephone Encounter (Signed)
Spoke to pt, Lab appt and OV scheduled.

## 2015-01-04 NOTE — Telephone Encounter (Signed)
Refill one 30 days only.  Has not been seen in one year so he needs fasting labs and a  30 minute visit.

## 2015-01-11 ENCOUNTER — Ambulatory Visit: Payer: Medicaid Other | Admitting: Internal Medicine

## 2015-01-18 ENCOUNTER — Other Ambulatory Visit: Payer: Medicaid Other

## 2015-01-24 ENCOUNTER — Ambulatory Visit (INDEPENDENT_AMBULATORY_CARE_PROVIDER_SITE_OTHER): Payer: Self-pay | Admitting: Internal Medicine

## 2015-01-24 DIAGNOSIS — I10 Essential (primary) hypertension: Secondary | ICD-10-CM

## 2015-01-26 NOTE — Assessment & Plan Note (Signed)
Patient failed to keep scheduled appointment and will be charged a no show fee.   

## 2015-01-26 NOTE — Progress Notes (Signed)
Subjective:  Patient ID: Nicole Jacobs, female    DOB: December 02, 1965  Age: 49 y.o. MRN: [id number]  CC: The encounter diagnosis was Essential hypertension.  HPI Nicole Jacobs  failed to keep scheduled 30 minute appointment, has not been seen in a year, and will be charged a no show fee.       Outpatient Prescriptions Prior to Visit  Medication Sig Dispense Refill  . bisoprolol-hydrochlorothiazide (ZIAC) 5-6.25 MG per tablet TAKE 2 TABLETS BY MOUTH DAILY 60 tablet 0  . cyanocobalamin (,VITAMIN B-12,) 1000 MCG/ML injection Inject 1 mL (1,000 mcg total) into the muscle every 30 (thirty) days. 10 mL 1  . HYDROcodone-acetaminophen (NORCO) 10-325 MG per tablet Take 1 tablet by mouth every 6 (six) hours as needed. 90 tablet 0  . predniSONE (STERAPRED UNI-PAK) 10 MG tablet 6 tablets on Day 1 , then reduce by 1 tablet daily until gone 21 tablet 0  . SYRINGE-NEEDLE, DISP, 3 ML (BD ECLIPSE SYRINGE) 25G X 1" 3 ML MISC Use to inject b12 every 30 days 12 each 2  . traMADol (ULTRAM) 50 MG tablet Take 1 tablet (50 mg total) by mouth every 8 (eight) hours as needed. 90 tablet 3  . traZODone (DESYREL) 50 MG tablet Take 0.5-1 tablets (25-50 mg total) by mouth at bedtime as needed for sleep. 30 tablet 3   No facility-administered medications prior to visit.    Review of Systems;  Patient denies headache, fevers, malaise, unintentional weight loss, skin rash, eye pain, sinus congestion and sinus pain, sore throat, dysphagia,  hemoptysis , cough, dyspnea, wheezing, chest pain, palpitations, orthopnea, edema, abdominal pain, nausea, melena, diarrhea, constipation, flank pain, dysuria, hematuria, urinary  Frequency, nocturia, numbness, tingling, seizures,  Focal weakness, Loss of consciousness,  Tremor, insomnia, depression, anxiety, and suicidal ideation.      Objective:  There were no vitals taken for this visit.  BP Readings from Last 3 Encounters:  01/10/14 114/60  12/11/13 142/74  12/12/12  116/68    Wt Readings from Last 3 Encounters:  01/10/14 116 lb (52.617 kg)  12/11/13 116 lb (52.617 kg)  12/12/12 133 lb 8 oz (60.555 kg)    General appearance: alert, cooperative and appears stated age Ears: normal TM's and external ear canals both ears Throat: lips, mucosa, and tongue normal; teeth and gums normal Neck: no adenopathy, no carotid bruit, supple, symmetrical, trachea midline and thyroid not enlarged, symmetric, no tenderness/mass/nodules Back: symmetric, no curvature. ROM normal. No CVA tenderness. Lungs: clear to auscultation bilaterally Heart: regular rate and rhythm, S1, S2 normal, no murmur, click, rub or gallop Abdomen: soft, non-tender; bowel sounds normal; no masses,  no organomegaly Pulses: 2+ and symmetric Skin: Skin color, texture, turgor normal. No rashes or lesions Lymph nodes: Cervical, supraclavicular, and axillary nodes normal.  No results found for: HGBA1C  Lab Results  Component Value Date   CREATININE 0.7 12/11/2013   CREATININE 0.8 12/12/2012   CREATININE 0.7 03/17/2012    Lab Results  Component Value Date   WBC 4.9 12/11/2013   HGB 12.6 12/11/2013   HCT 37.5 12/11/2013   PLT 325.0 12/11/2013   GLUCOSE 83 12/11/2013   CHOL 154 03/17/2012   TRIG 97.0 03/17/2012   HDL 87.20 03/17/2012   LDLCALC 47 03/17/2012   ALT 14 12/11/2013   AST 15 12/11/2013   NA 140 12/11/2013   K 4.1 12/11/2013   CL 104 12/11/2013   CREATININE 0.7 12/11/2013   BUN 14 12/11/2013   CO2 28  12/11/2013   TSH 0.84 12/11/2013    No results found.  Assessment & Plan:   Problem List Items Addressed This Visit    Hypertension - Primary    Patient failed to keep scheduled appointment and will be charged a no show fee.           I have discontinued Nicole Jacobs's traZODone, traMADol, cyanocobalamin, SYRINGE-NEEDLE (DISP) 3 ML, HYDROcodone-acetaminophen, predniSONE, and bisoprolol-hydrochlorothiazide.  No orders of the defined types were placed in this  encounter.    Medications Discontinued During This Encounter  Medication Reason  . HYDROcodone-acetaminophen (NORCO) 10-325 MG per tablet Discontinued by provider  . predniSONE (STERAPRED UNI-PAK) 10 MG tablet   . traMADol (ULTRAM) 50 MG tablet   . traZODone (DESYREL) 50 MG tablet   . bisoprolol-hydrochlorothiazide (ZIAC) 5-6.25 MG per tablet   . cyanocobalamin (,VITAMIN B-12,) 1000 MCG/ML injection   . SYRINGE-NEEDLE, DISP, 3 ML (BD ECLIPSE SYRINGE) 25G X 1" 3 ML MISC     Follow-up: No Follow-up on file.   Sherlene ShamsULLO, Wynter Isaacs L, MD

## 2015-02-13 ENCOUNTER — Encounter: Payer: Self-pay | Admitting: Emergency Medicine

## 2015-02-13 ENCOUNTER — Ambulatory Visit
Admission: EM | Admit: 2015-02-13 | Discharge: 2015-02-13 | Disposition: A | Discharge status: Home / Self Care | Payer: Self-pay | Attending: Internal Medicine | Admitting: Internal Medicine

## 2015-02-13 DIAGNOSIS — L0103 Bullous impetigo: Secondary | ICD-10-CM | POA: Insufficient documentation

## 2015-02-13 DIAGNOSIS — S60424A Blister (nonthermal) of right ring finger, initial encounter: Secondary | ICD-10-CM | POA: Insufficient documentation

## 2015-02-13 DIAGNOSIS — X58XXXA Exposure to other specified factors, initial encounter: Secondary | ICD-10-CM | POA: Insufficient documentation

## 2015-02-13 HISTORY — DX: Essential (primary) hypertension: I10

## 2015-02-13 HISTORY — DX: Anemia, unspecified: D64.9

## 2015-02-13 MED ORDER — CEFTRIAXONE SODIUM 1 G IJ SOLR
1.0000 g | Freq: Once | INTRAMUSCULAR | Status: AC
  Administered 2015-02-13: 1 g via INTRAMUSCULAR

## 2015-02-13 MED ORDER — SULFAMETHOXAZOLE-TRIMETHOPRIM 800-160 MG PO TABS
1.0000 | ORAL_TABLET | Freq: Once | ORAL | Status: AC
  Administered 2015-02-13: 1 via ORAL

## 2015-02-13 MED ORDER — SULFAMETHOXAZOLE-TRIMETHOPRIM 800-160 MG PO TABS: 1.0000 | ORAL_TABLET | Freq: Two times a day (BID) | ORAL | Status: DC

## 2015-02-13 NOTE — ED Provider Notes (Signed)
CSN: [id number]     Arrival date & time 02/13/15  1649 History   First MD Initiated Contact with Patient 02/13/15 1735     Chief Complaint  Patient presents with  . Finger Injury  . Poison Ivy   HPI   49 year old lady who presents with a large tense and painful blister on the pad of the right fourth finger, which has progressed from a "paper cut" in the same area yesterday. Patient at one point also refers to a "sliver" in that area, a little bit unclear. No fever, no malaise. The finger is red, and there is a red streak on the dorsal aspect of the right hand over the fourth MCP. No history of trauma, no recalled history of injury such as scratch or foreign body. Patient also reports "poison ivy", with focal patches at the bridge of the nose, at the left corner of the lips, and on the right leg. These patches are crusted and blistery.  Past Medical History  Diagnosis Date  . Anemia   . Hypertension    Past Surgical History  Procedure Laterality Date  . No past surgeries     No family history on file. History  Substance Use Topics  . Smoking status: Never Smoker   . Smokeless tobacco: Never Used  . Alcohol Use: No   OB History    No data available     Review of Systems  All other systems reviewed and are negative.   Allergies  Penicillins  Home Medications   Prior to Admission medications   Medication Sig Start Date End Date Taking? Authorizing Provider  bisoprolol (ZEBETA) 5 MG tablet Take 5 mg by mouth 2 (two) times daily.   Yes Historical Provider, MD  vitamin B-12 (CYANOCOBALAMIN) 100 MCG tablet Take 100 mcg by mouth once.   Yes Historical Provider, MD          BP 142/77 mmHg  Pulse 95  Temp(Src) 98.6 F (37 C) (Tympanic)  Ht 5\' 3"  (1.6 m)  Wt 115 lb (52.164 kg)  BMI 20.38 kg/m2  SpO2 96%  LMP  (Approximate) Physical Exam  Constitutional: She is oriented to person, place, and time. No distress.  Alert, nicely groomed  HENT:  Head: Atraumatic.  Eyes:   Conjugate gaze, no eye redness/drainage  Neck: Neck supple.  Cardiovascular: Normal rate.   Pulmonary/Chest: No respiratory distress.  Abdominal: She exhibits no distension.  Musculoskeletal: Normal range of motion.  No leg swelling  Neurological: She is alert and oriented to person, place, and time.  Skin: Skin is warm and dry.  No cyanosis Right fourth finger is red, most prominent distally, with a 12 mm tense hemorrhagic/cloudy blister present, very tender. Still able to flex at the DIP, PIP.  Erythema extends down the finger, and onto the dorsal surface of the right hand overlying the fourth metacarpal. There is minimal swelling of the fourth finger, except right around the blister. The finger pad itself is not tensely swollen.   There is a 3 cm blistery/crusted patch at the bridge of the nose, with makeup covering it.  There is a 2 cm blistery crusted patch at the left corner of the mouth.  On one of the fingers of the left hand there is a slightly hyperpigmented, depressed area, 8 mm, that the patient says is resolving "poison ivy"  On the right lateral lower leg there is a 3 cm linear red streak, without crusting/vesiculation, not raised.  Nursing note and vitals reviewed.  ED Course  Procedures Labs Reviewed  BODY FLUID CULTURE   The distal right fourth finger is prepped with Hibiclens, and a puncture was made in the lateral aspect of the blister with a 23-gauge needle. Copious return of seropurulent fluid ensued, and a swab was sent to the lab for culture. Patient experienced some relief in pain, but not complete resolution. The site was washed again with Hibiclens and dressed with antibiotic ointment and a bandage.  Patient received 1 g of Rocephin IM at the urgent care, and an oral dose of Bactrim double strength.    MDM   1. Bullous impetigo    Prescription for Bactrim was sent to the [facility] in [place].   Patient will recheck at the urgent care tomorrow, with  the expectation that the redness and pain will not be significantly worse. Await blister fluid culture results. If not improving in 48 hours, would consider the differential diagnosis of blistering skin disorders, including pemphigus, erythema multiforme.  It is also possible that there is an occult foreign body such as a splinter in the finger pad. It was not possible to assess this today because of pain and erythema, and the presence of the blister sac.    Sherlene Shams, MD 02/13/15 [phone]

## 2015-02-13 NOTE — ED Notes (Addendum)
Patient had small cut on ring finger on right hand. Now it is swollen like a blood blister, painful and numb.  Poison IVY  On face and legs for 1 week. Has tried calamine lotion

## 2015-02-13 NOTE — Discharge Instructions (Signed)
Blister fluid from the R 4th finger was sent to the lab for culture.  You had an injection of ceftriaxone and the first dose of trimethoprim/sulfamethoxazole (antibiotics) at the urgent care today. A prescription for trimethoprim/sulfamethoxazole was sent to the [facility] in [place]. Please return to the urgent care tomorrow to have finger and red streak on hand checked.

## 2015-02-14 ENCOUNTER — Ambulatory Visit
Admission: EM | Admit: 2015-02-14 | Discharge: 2015-02-14 | Disposition: A | Discharge status: Home / Self Care | Payer: Self-pay | Attending: Internal Medicine | Admitting: Internal Medicine

## 2015-02-14 DIAGNOSIS — B957 Other staphylococcus as the cause of diseases classified elsewhere: Secondary | ICD-10-CM

## 2015-02-14 DIAGNOSIS — L0103 Bullous impetigo: Secondary | ICD-10-CM

## 2015-02-14 NOTE — ED Notes (Signed)
Pt states "I am here for a wound recheck of my right index finger. It does feel some better."

## 2015-02-16 NOTE — ED Provider Notes (Signed)
CSN: [id number]     Arrival date & time 02/14/15  1936 History   First MD Initiated Contact with Patient 02/14/15 2017     Chief Complaint  Patient presents with  . Wound Check   HPI   Presented yesterday evening with a tense, painful, hemorrhagic bulla on R 4th finger pad, and crusted/blistery patches bridge of nose, left corner of mouth.  R dorsal hand with red streak over 4th metacarpal.  Treated for bullous impetigo with bactrim, injection of rocephin 1g IM, after bulla on finger drained and material in blister sent for culture. Feeling better today, finger less painful although still hurts.  Red streak on hand improving.  Past Medical History  Diagnosis Date  . Anemia   . Hypertension    Past Surgical History  Procedure Laterality Date  . No past surgeries     No family history on file. History  Substance Use Topics  . Smoking status: Never Smoker   . Smokeless tobacco: Never Used  . Alcohol Use: No   OB History    No data available     Review of Systems  All other systems reviewed and are negative.   Allergies  Penicillins  Home Medications   Prior to Admission medications   Medication Sig Start Date End Date Taking? Authorizing Provider  bisoprolol (ZEBETA) 5 MG tablet Take 5 mg by mouth 2 (two) times daily.    Historical Provider, MD  sulfamethoxazole-trimethoprim (BACTRIM DS,SEPTRA DS) 800-160 MG per tablet Take 1 tablet by mouth 2 (two) times daily. 02/13/15 02/20/15  Eustace Moore, MD  vitamin B-12 (CYANOCOBALAMIN) 100 MCG tablet Take 100 mcg by mouth once.    Historical Provider, MD   BP 96/69 mmHg  Pulse 60  Temp(Src) 97.5 F (36.4 C) (Tympanic)  Resp 16  Ht 5\' 3"  (1.6 m)  Wt 115 lb (52.164 kg)  BMI 20.38 kg/m2  SpO2 100%  LMP  (Approximate) Physical Exam  Constitutional: She is oriented to person, place, and time. No distress.  Alert, nicely groomed  HENT:  Head: Atraumatic.  Eyes:  Conjugate gaze, no eye redness/drainage  Neck: Neck supple.   Cardiovascular: Normal rate.   Pulmonary/Chest: No respiratory distress.  Abdominal: She exhibits no distension.  Musculoskeletal: Normal range of motion.  No leg swelling  Neurological: She is alert and oriented to person, place, and time.  Skin: Skin is warm and dry.  No cyanosis R 4th finger with fading erythema, decrease in diffuse swelling.  Pad of finger less tender.  Dorsal hand with fading erythema over 4th metacarpal.   Patches near mouth, over nose, with less erythema, smaller in size.   Nursing note and vitals reviewed.   ED Course  Procedures   Labs Review  Blister fluid, obtained 02/13/15 and sent for culture, growing staph.   MDM   1. Bullous staphylococcal impetigo   Continue bactrim.  Recheck R 4th finger pad wound in 2d.    Eustace Moore, MD 02/16/15 [phone]

## 2015-02-17 LAB — BODY FLUID CULTURE

## 2015-02-19 ENCOUNTER — Ambulatory Visit
Admission: EM | Admit: 2015-02-19 | Discharge: 2015-02-19 | Disposition: A | Discharge status: Home / Self Care | Payer: Self-pay | Attending: Family Medicine | Admitting: Family Medicine

## 2015-02-19 DIAGNOSIS — L02511 Cutaneous abscess of right hand: Secondary | ICD-10-CM

## 2015-02-19 DIAGNOSIS — IMO0001 Reserved for inherently not codable concepts without codable children: Secondary | ICD-10-CM

## 2015-02-19 NOTE — ED Provider Notes (Signed)
CSN: [id number]     Arrival date & time 02/19/15  1751 History   First MD Initiated Contact with Patient 02/19/15 1824     Chief Complaint  Patient presents with  . Wound Check   (Consider location/radiation/quality/duration/timing/severity/associated sxs/prior Treatment) HPI 49 yo F being followed for abscess right 4th finger- culture + MRSA- is tolerating her TMP-SMZ well- 3 days to go. Much improved-erythema resolved- discomfort improved. Presents for re-evaluation  Past Medical History  Diagnosis Date  . Anemia   . Hypertension    Past Surgical History  Procedure Laterality Date  . No past surgeries     History reviewed. No pertinent family history. History  Substance Use Topics  . Smoking status: Never Smoker   . Smokeless tobacco: Never Used  . Alcohol Use: No   OB History    No data available     Review of Systems Review of 10 systems negative for acute change except as referenced in HPI  Allergies  Penicillins  Home Medications   Prior to Admission medications   Medication Sig Start Date End Date Taking? Authorizing Provider  bisoprolol (ZEBETA) 5 MG tablet Take 5 mg by mouth 2 (two) times daily.   Yes Historical Provider, MD  sulfamethoxazole-trimethoprim (BACTRIM DS,SEPTRA DS) 800-160 MG per tablet Take 1 tablet by mouth 2 (two) times daily. 02/13/15 02/20/15 Yes Eustace MooreLaura W Murray, MD  vitamin B-12 (CYANOCOBALAMIN) 100 MCG tablet Take 100 mcg by mouth once.   Yes Historical Provider, MD   BP 144/81 mmHg  Pulse 80  Temp(Src) 97.5 F (36.4 C) (Tympanic)  Resp 16  Ht 5\' 3"  (1.6 m)  Wt 115 lb (52.164 kg)  BMI 20.38 kg/m2  SpO2 100%  LMP  (Approximate) Physical Exam Well developed Female reporting no distress, VSS. Afebrile   Right 4th finger abscess has almost resolved and she is very pleased. Slight tenderness persists in mid finger but streaking of dorsum and erythema of finger and palm have resolved. There is mild fluctuance still present in area of original  abscess and it may well organize and drain again. Precautions reviewed. FROM all joints finger. Good cap fill  The "rash" on her face has also healed and is no longer a problem  Culture positive for MRSA- discussed with patient. Informational handout given.   ED Course  Procedures (including critical care time) Labs Review Labs Reviewed - No data to display  Imaging Review No results found.   MDM   1. Abscess of fourth finger, right     Plan: 1. Test results and diagnosis reviewed with patient. MRSA precautions reviewed 2. Complete remainder of Rx as per orders;  3. Recommend supportive treatment with sealed bandaid dressings. Additional rupture may evolve 4. F/u prn if symptoms worsen or don't improve- return Friday for follow up if concerns as Rx will have ended.   Rae HalstedLaurie W Lee, PA-C 02/19/15 1944

## 2015-02-19 NOTE — ED Notes (Signed)
Hx Impetigo to face and right 4th finger that was drained and + for MRSA. Much improved from initial visit

## 2015-02-22 ENCOUNTER — Ambulatory Visit
Admission: EM | Admit: 2015-02-22 | Discharge: 2015-02-22 | Disposition: A | Discharge status: Home / Self Care | Payer: Self-pay | Attending: Physician Assistant | Admitting: Physician Assistant

## 2015-02-22 DIAGNOSIS — IMO0001 Reserved for inherently not codable concepts without codable children: Secondary | ICD-10-CM

## 2015-02-22 DIAGNOSIS — L02511 Cutaneous abscess of right hand: Secondary | ICD-10-CM

## 2015-02-22 MED ORDER — MUPIROCIN CALCIUM 2 % EX CREA: 1.0000 "application " | TOPICAL_CREAM | Freq: Three times a day (TID) | CUTANEOUS | Status: DC

## 2015-03-01 IMAGING — CR DG HIP COMPLETE 2+V*L*
1 series · 3 of 3 positions shown · non-contrast
Comparison: None.

CLINICAL DATA: Pain.

EXAM:
LEFT HIP - COMPLETE 2+ VIEW

[Series 1: t hip ap left · 0.14mm/px · 3 of 3 slices shown]
[im 1/3]
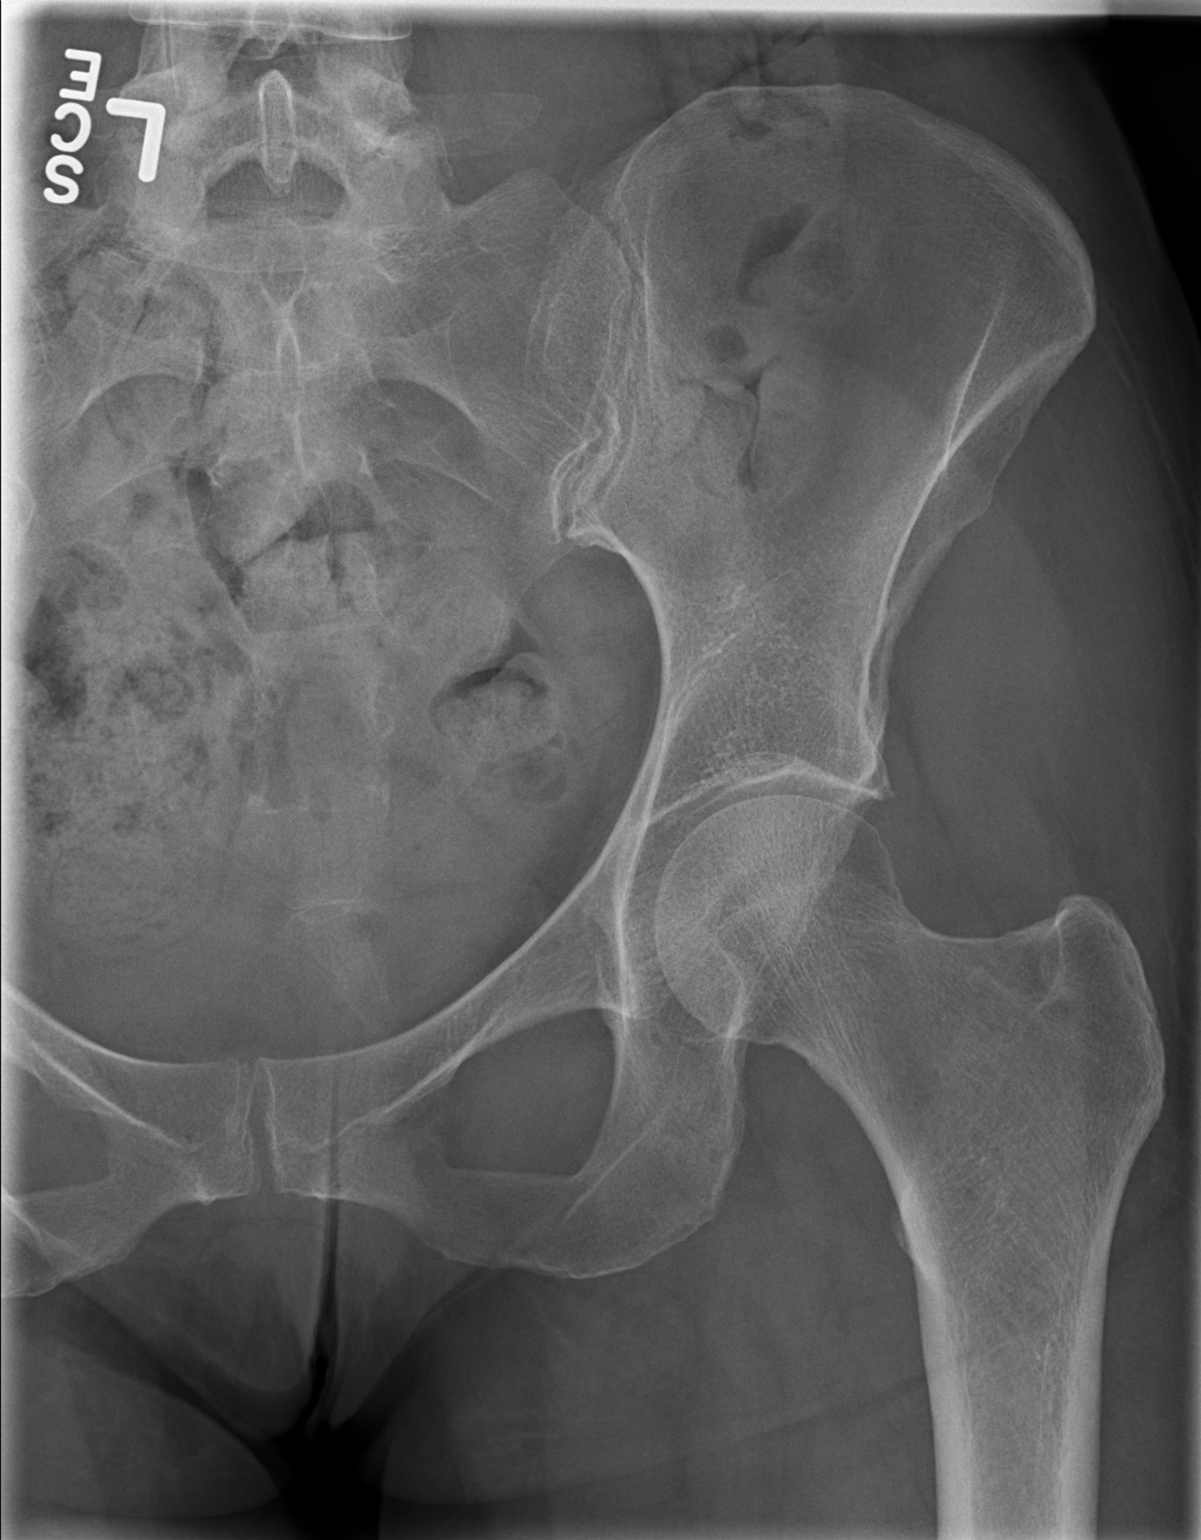
[im 2/3]
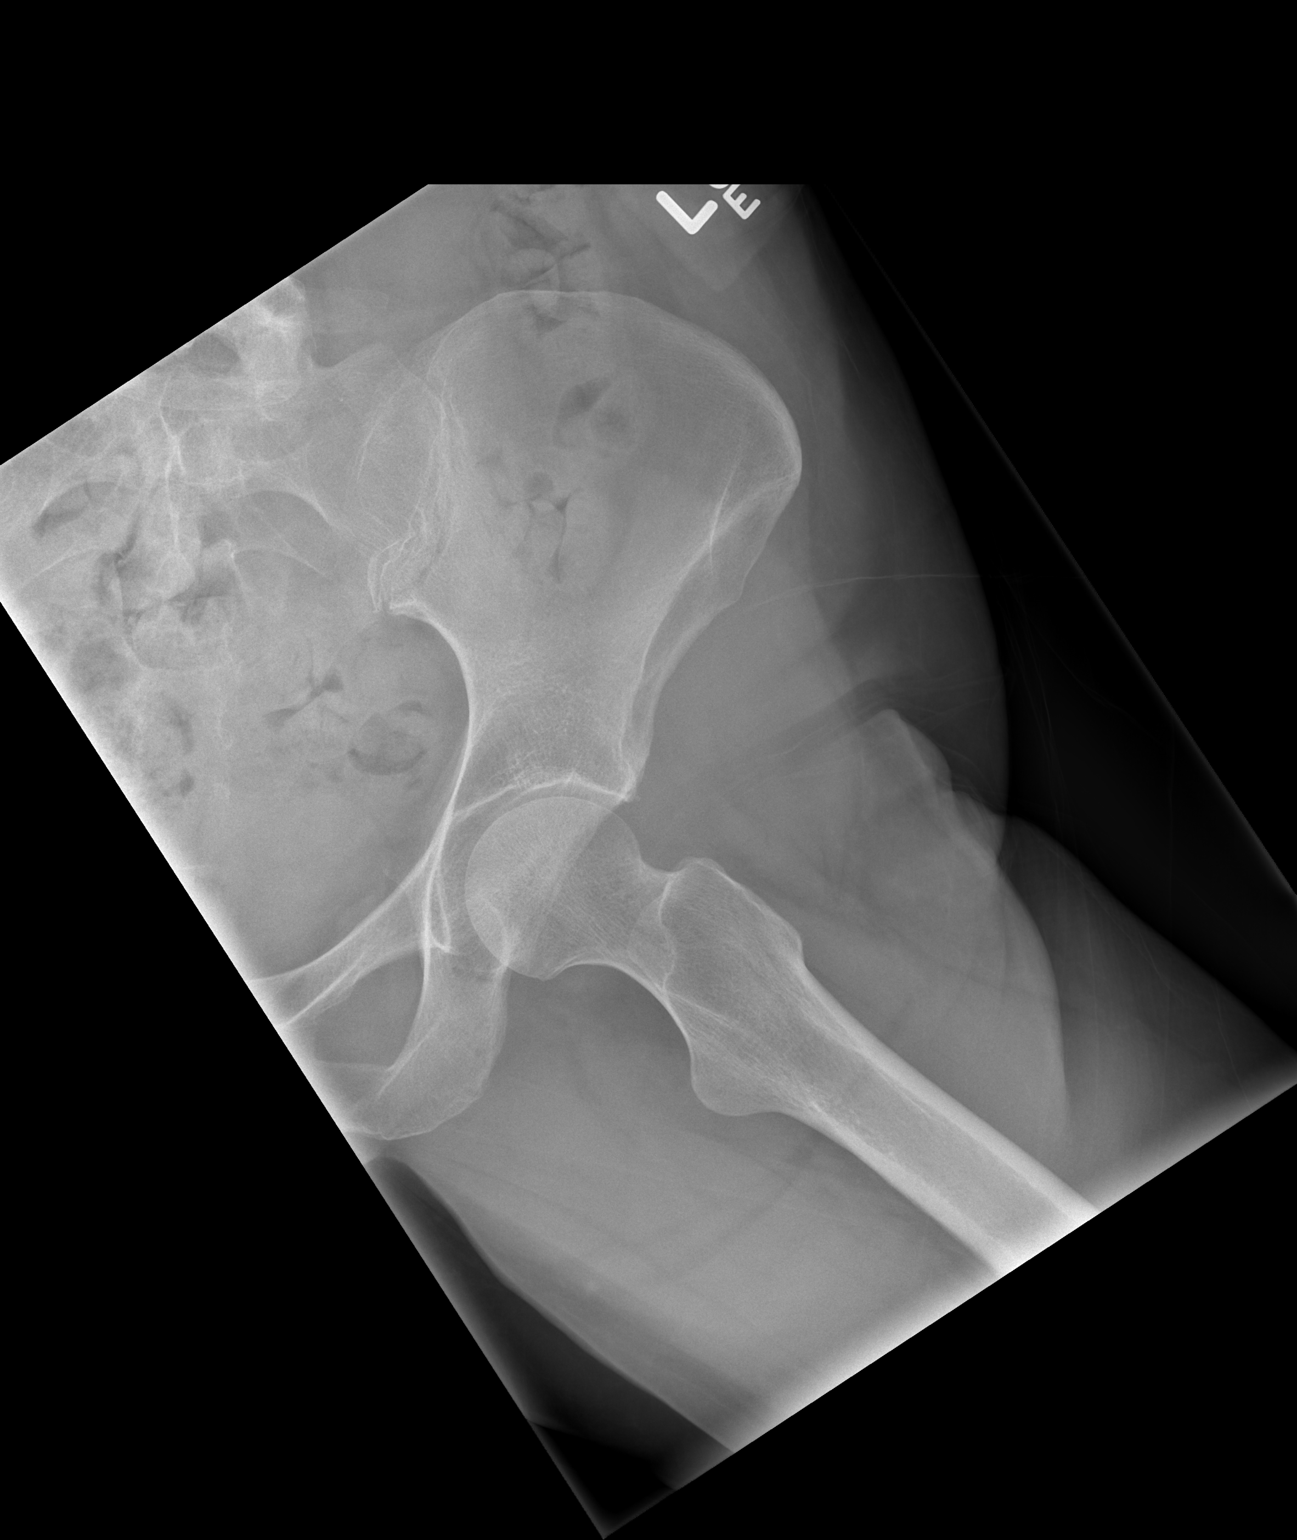
[im 3/3]
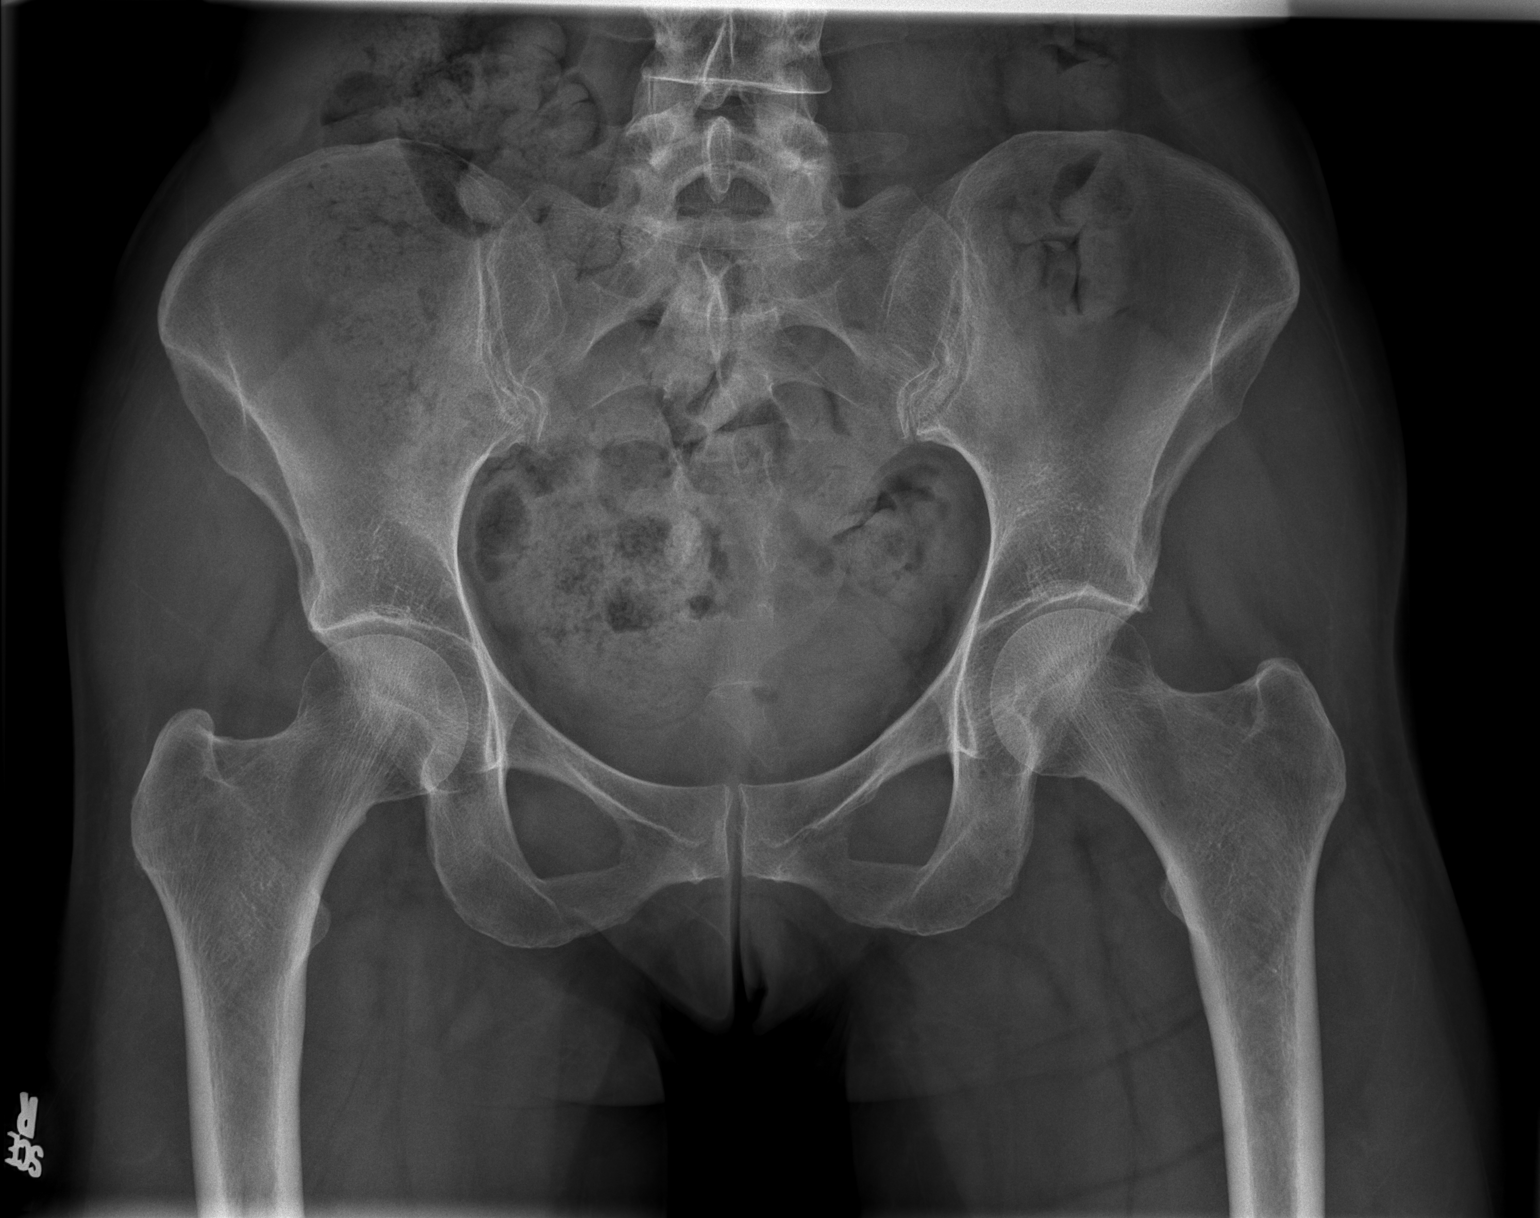

[3 of 3 positions shown; findings below may reference images not displayed]

FINDINGS: No acute bony or joint abnormality identified. No evidence of
fracture dislocation.
IMPRESSION: No acute abnormality.

## 2015-03-01 IMAGING — CR DG LUMBAR SPINE COMPLETE 4+V
1 series · 5 of 5 positions shown · non-contrast
Comparison: MR LUMBAR SPINE W/O CM dated 12/29/2010

CLINICAL DATA: Pain.

EXAM:
LUMBAR SPINE - COMPLETE 4+ VIEW

[Series 1: t lumbar spine ap · 0.14mm/px · 5 of 5 slices shown]
[im 1/5]
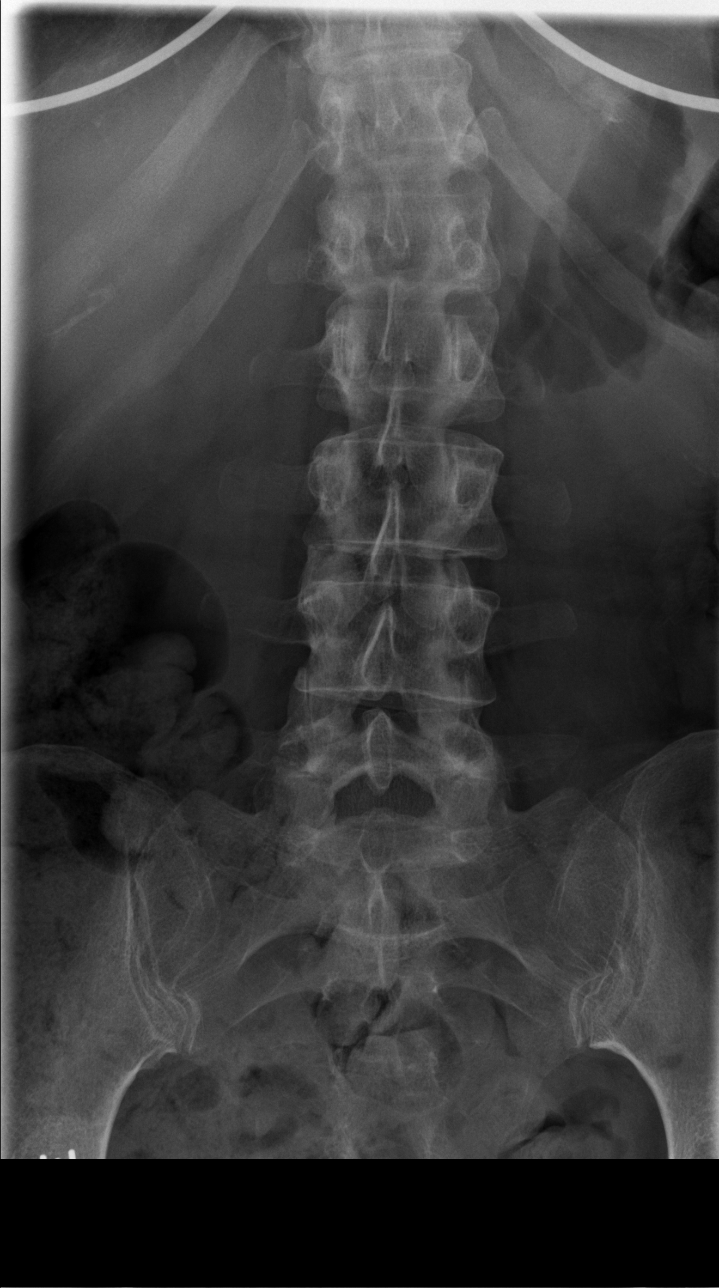
[im 2/5]
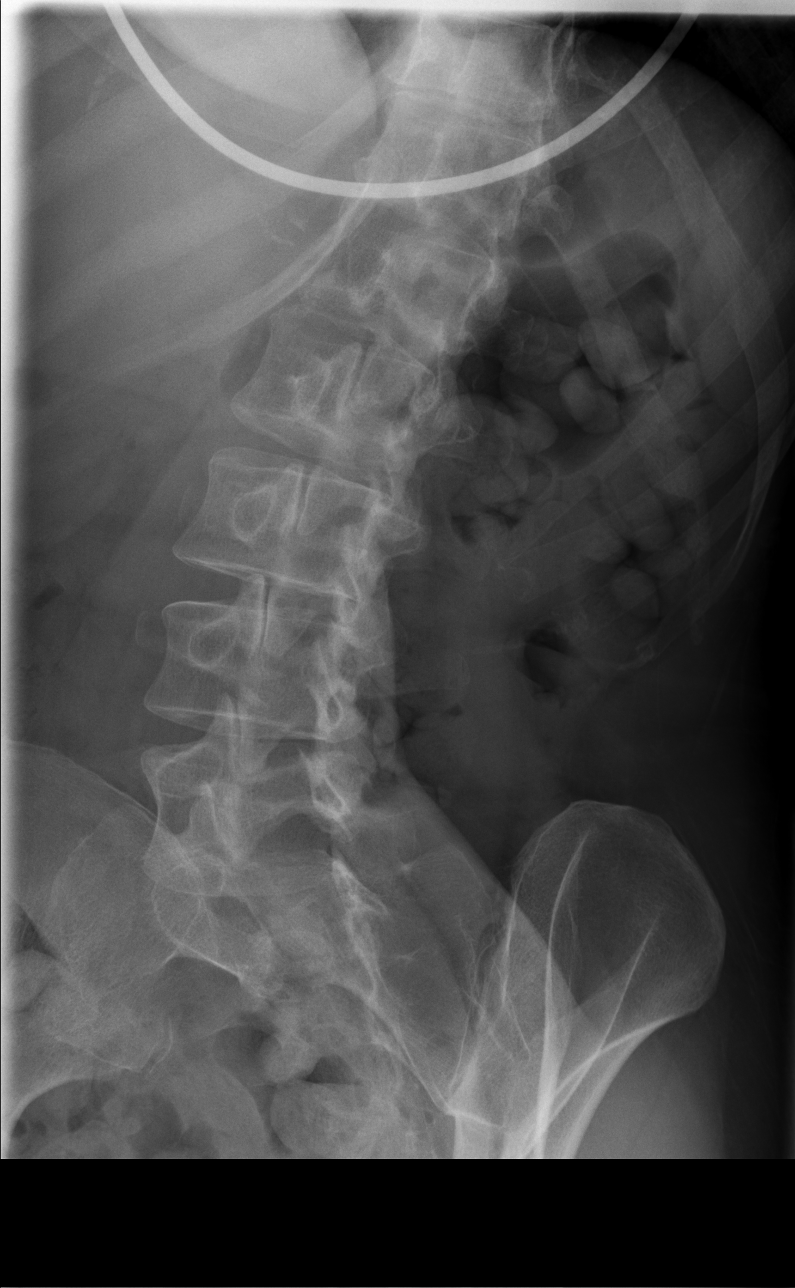
[im 3/5]
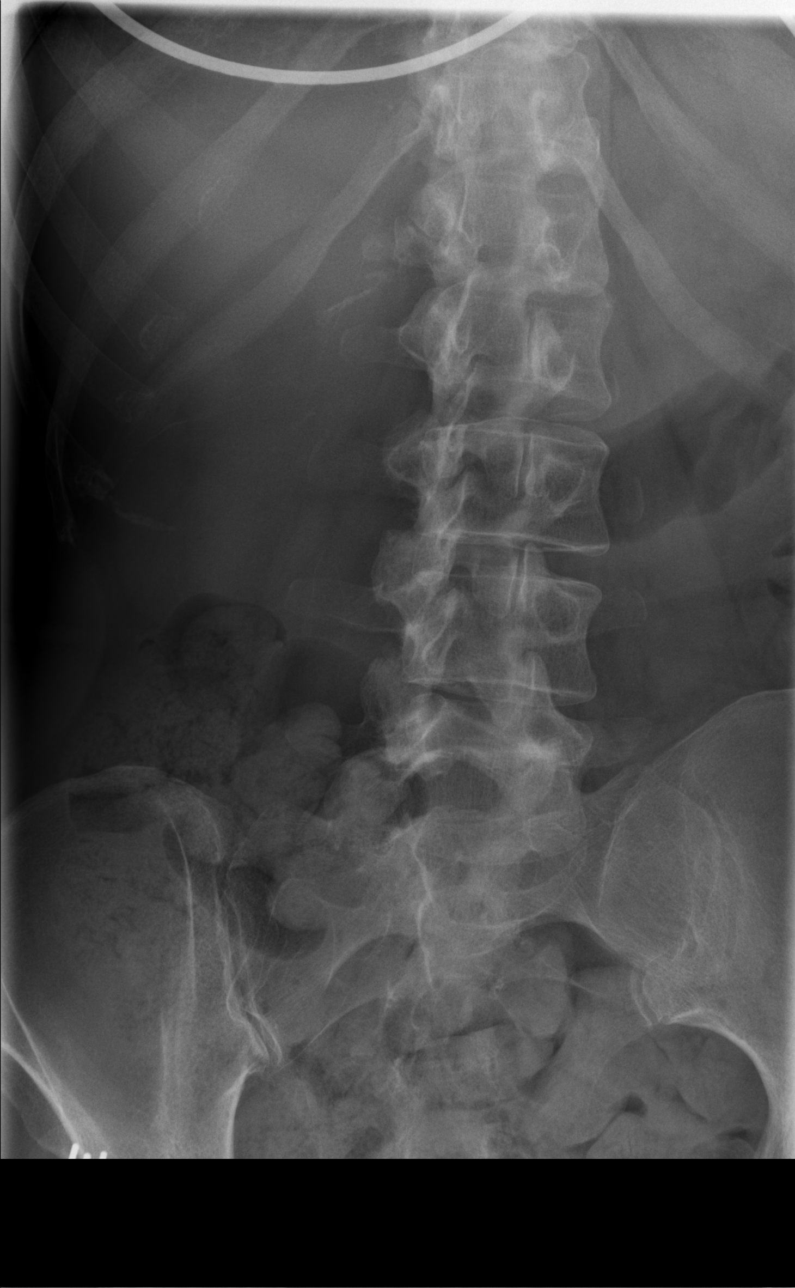
[im 4/5]
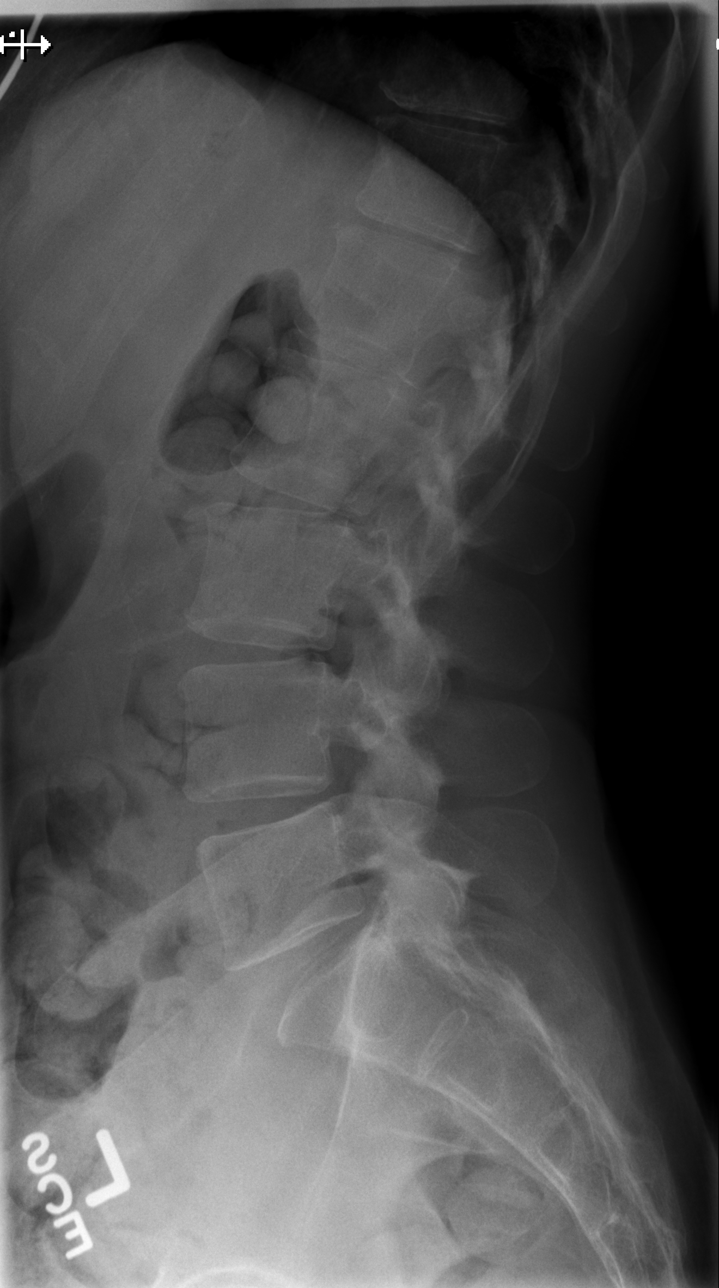
[im 5/5]
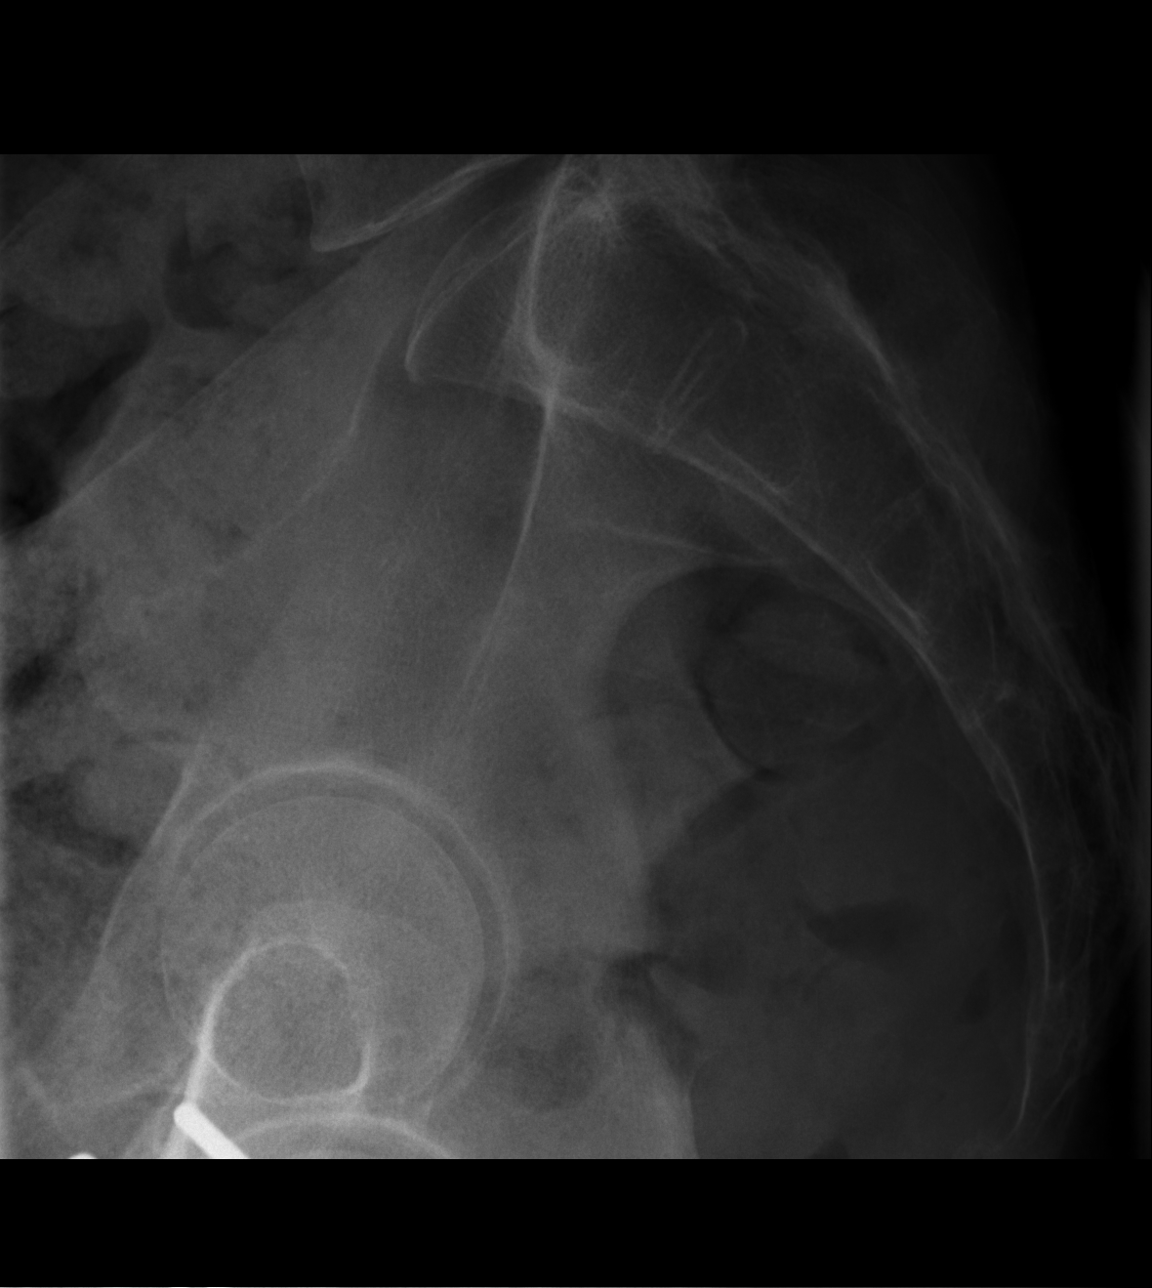

[5 of 5 positions shown; findings below may reference images not displayed]

FINDINGS: Diffuse degenerative changes lumbar spine. No acute bony abnormality
identified.
IMPRESSION: Diffuse mild multilevel degenerative change, no acute abnormality.

## 2015-03-14 ENCOUNTER — Telehealth: Payer: Self-pay | Admitting: *Deleted

## 2015-03-14 MED ORDER — CYANOCOBALAMIN 1000 MCG/ML IJ SOLN: INTRAMUSCULAR | Status: DC

## 2015-03-14 MED ORDER — SYRINGE (DISPOSABLE) 1 ML MISC: Status: DC

## 2015-03-14 NOTE — Telephone Encounter (Signed)
Ok to refill,  Refill sent  

## 2015-03-14 NOTE — Telephone Encounter (Signed)
Meaghan from the pharmacy called requesting B12  inject 10 mL vial refill.  Medication not listed on med list.  Please advise refill.

## 2015-04-03 ENCOUNTER — Other Ambulatory Visit: Payer: Self-pay | Admitting: Internal Medicine

## 2015-04-04 ENCOUNTER — Other Ambulatory Visit: Payer: Self-pay

## 2015-04-04 NOTE — Telephone Encounter (Signed)
Is it okay to refill for the 6 months. I ask because you gave her two months ago. Please advise on how many months you'd like it to be refilled for

## 2015-04-05 MED ORDER — BISOPROLOL FUMARATE 5 MG PO TABS: 5.0000 mg | ORAL_TABLET | Freq: Two times a day (BID) | ORAL | Status: DC

## 2015-04-05 NOTE — Telephone Encounter (Signed)
Patient no showed last OV 6/16 ok to fill?

## 2015-05-01 ENCOUNTER — Ambulatory Visit (INDEPENDENT_AMBULATORY_CARE_PROVIDER_SITE_OTHER): Payer: Self-pay | Admitting: Internal Medicine

## 2015-05-01 ENCOUNTER — Encounter: Payer: Self-pay | Admitting: Internal Medicine

## 2015-05-01 VITALS — BP 106/68 | HR 60 | Temp 98.1°F | Resp 12 | Ht 63.0 in | Wt 106.0 lb

## 2015-05-01 DIAGNOSIS — I10 Essential (primary) hypertension: Secondary | ICD-10-CM

## 2015-05-01 LAB — COMPREHENSIVE METABOLIC PANEL
ALK PHOS: 74 U/L (ref 39–117)
ALT: 12 U/L (ref 0–35)
AST: 14 U/L (ref 0–37)
Albumin: 3.9 g/dL (ref 3.5–5.2)
BILIRUBIN TOTAL: 0.4 mg/dL (ref 0.2–1.2)
BUN: 14 mg/dL (ref 6–23)
CO2: 31 meq/L (ref 19–32)
Calcium: 8.9 mg/dL (ref 8.4–10.5)
Chloride: 103 mEq/L (ref 96–112)
Creatinine, Ser: 0.76 mg/dL (ref 0.40–1.20)
GFR: 86.37 mL/min (ref >60.00)
Glucose, Bld: 59 mg/dL — ABNORMAL LOW (ref 70–99)
Potassium: 3.9 mEq/L (ref 3.5–5.1)
SODIUM: 139 meq/L (ref 135–145)
TOTAL PROTEIN: 6.6 g/dL (ref 6.0–8.3)

## 2015-05-01 NOTE — Progress Notes (Signed)
 Subjective:  Patient ID: Nicole Jacobs, female    DOB: 07/26/1966  Age: 49 y.o. MRN: [id number]  CC: The encounter diagnosis was Essential hypertension.  HPI Nicole Jacobs presents for follow up on hypertension . Last seen MAY 2015.  Taking BP medications  without side effects .  Uninsured, so has not had a mammogram since 2012.   Has not had labs in over a year,.    Treated for MRSA abscess on ring finger in July ,  Required incision and drainage by Urgent Care MD.  required packing and daily checks. 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   Outpatient Prescriptions Prior to Visit  Medication Sig Dispense Refill  . bisoprolol-hydrochlorothiazide (ZIAC) 5-6.25 MG per tablet TAKE 2 TABLETS BY MOUTH DAILY 60 tablet 0  . cyanocobalamin (,VITAMIN B-12,) 1000 MCG/ML injection For use with monthly B12 injections 10 mL 2  . mupirocin cream (BACTROBAN) 2 % Apply 1 application topically 3 (three) times daily. Pease fill with ointment ... 15 g 0  . Syringe, Disposable, 1 ML MISC For use with B12 injections monthlt 25 each 0  . bisoprolol (ZEBETA) 5 MG tablet Take 1 tablet (5 mg total) by mouth 2 (two) times daily. 60 tablet 6   No facility-administered medications prior to visit.    Review of Systems;  Patient denies headache, fevers, malaise, unintentional weight loss, skin rash, eye pain, sinus congestion and sinus pain, sore throat, dysphagia,  hemoptysis , cough, dyspnea, wheezing, chest pain, palpitations, orthopnea, edema, abdominal pain, nausea, melena, diarrhea, constipation, flank pain, dysuria, hematuria, urinary  Frequency, nocturia, numbness, tingling, seizures,  Focal weakness, Loss of consciousness,  Tremor, insomnia, depression, anxiety, and suicidal ideation.      Objective:  BP 106/68 mmHg  Pulse 60  Temp(Src) 98.1 F (36.7 C) (Oral)  Resp 12  Ht 5' 3" (1.6 m)  Wt 106 lb (48.081 kg)  BMI 18.78 kg/m2  SpO2 99%  LMP   (Approximate)  BP Readings from Last 3 Encounters:  05/01/15 106/68  02/22/15 135/77  02/19/15 144/81    Wt Readings from Last 3 Encounters:  05/01/15 106 lb (48.081 kg)  02/22/15 115 lb (52.164 kg)  02/19/15 115 lb (52.164 kg)    General appearance: alert, cooperative and appears stated age Ears: normal TM's and external ear canals both ears Throat: lips, mucosa, and tongue normal; teeth and gums normal Neck: no adenopathy, no carotid bruit, supple, symmetrical, trachea midline and thyroid not enlarged, symmetric, no tenderness/mass/nodules Back: symmetric, no curvature. ROM normal. No CVA tenderness. Lungs: clear to auscultation bilaterally Heart: regular rate and rhythm, S1, S2 normal, no murmur, click, rub or gallop Abdomen: soft, non-tender; bowel sounds normal; no masses,  no organomegaly Pulses: 2+ and symmetric Skin: Skin color, texture, turgor normal. No rashes or lesions Lymph nodes: Cervical, supraclavicular, and axillary nodes normal.  No results found for: HGBA1C  Lab Results  Component Value Date   CREATININE 0.76 05/01/2015   CREATININE 0.7 12/11/2013   CREATININE 0.8 12/12/2012    Lab Results  Component Value Date   WBC 4.9 12/11/2013   HGB 12.6 12/11/2013   HCT 37.5 12/11/2013   PLT 325.0 12/11/2013   GLUCOSE 59* 05/01/2015   CHOL 154 03/17/2012   TRIG 97.0 03/17/2012   HDL 87.20 03/17/2012   LDLCALC 47 03/17/2012   ALT 12 05/01/2015   AST 14 05/01/2015   NA 139 05/01/2015   K 3.9 05/01/2015     CL 103 05/01/2015   CREATININE 0.76 05/01/2015   BUN 14 05/01/2015   CO2 31 05/01/2015   TSH 0.84 12/11/2013    No results found.  Assessment & Plan:   Problem List Items Addressed This Visit      Unprioritized   Hypertension - Primary    Well controlled on current regimen. Renal function stable, no changes today.  Lab Results  Component Value Date   CREATININE 0.76 05/01/2015   Lab Results  Component Value Date   NA 139 05/01/2015   K  3.9 05/01/2015   CL 103 05/01/2015   CO2 31 05/01/2015         Relevant Orders   Comp Met (CMET) (Completed)      I have discontinued Ms. Abernethy's bisoprolol. I am also having her maintain her mupirocin cream, cyanocobalamin, Syringe (Disposable), and bisoprolol-hydrochlorothiazide.  No orders of the defined types were placed in this encounter.    Medications Discontinued During This Encounter  Medication Reason  . bisoprolol (ZEBETA) 5 MG tablet     Follow-up: No Follow-up on file.   TULLO, TERESA L, MD  

## 2015-05-01 NOTE — Assessment & Plan Note (Signed)
Well controlled on current regimen. Renal function stable, no changes today.  Lab Results  Component Value Date   CREATININE 0.76 05/01/2015   Lab Results  Component Value Date   NA 139 05/01/2015   K 3.9 05/01/2015   CL 103 05/01/2015   CO2 31 05/01/2015

## 2015-05-01 NOTE — Patient Instructions (Signed)
Your BP is well controlled.  Remember to suspend your medication if you have diarrhea or vomiting type illness for  A few days to avoid getting dehydrated.   You may want to consider getting your healthcare at the [facility] until you  Obtain insurance to help keep your costs down.  I have given you the contact information for several clinics in the area that provide excellent low cost care

## 2015-05-01 NOTE — Progress Notes (Signed)
Pre-visit discussion using our clinic review tool. No additional management support is needed unless otherwise documented below in the visit note.  

## 2015-05-06 ENCOUNTER — Encounter: Payer: Self-pay | Admitting: *Deleted

## 2016-06-23 ENCOUNTER — Other Ambulatory Visit: Payer: Self-pay | Admitting: Internal Medicine

## 2016-06-23 MED ORDER — BISOPROLOL-HYDROCHLOROTHIAZIDE 5-6.25 MG PO TABS: 2.0000 | ORAL_TABLET | Freq: Every day | ORAL | 2 refills | Status: DC

## 2021-09-09 ENCOUNTER — Other Ambulatory Visit: Payer: Self-pay | Admitting: Internal Medicine

## 2021-09-09 ENCOUNTER — Ambulatory Visit (INDEPENDENT_AMBULATORY_CARE_PROVIDER_SITE_OTHER): Payer: Medicaid Other | Admitting: Internal Medicine

## 2021-09-09 ENCOUNTER — Encounter: Payer: Self-pay | Admitting: Internal Medicine

## 2021-09-09 ENCOUNTER — Other Ambulatory Visit: Payer: Self-pay

## 2021-09-09 VITALS — BP 178/98 | HR 127 | Ht 63.0 in | Wt 125.0 lb

## 2021-09-09 DIAGNOSIS — K219 Gastro-esophageal reflux disease without esophagitis: Secondary | ICD-10-CM | POA: Insufficient documentation

## 2021-09-09 DIAGNOSIS — E538 Deficiency of other specified B group vitamins: Secondary | ICD-10-CM | POA: Insufficient documentation

## 2021-09-09 DIAGNOSIS — I10 Essential (primary) hypertension: Secondary | ICD-10-CM

## 2021-09-09 DIAGNOSIS — Z1231 Encounter for screening mammogram for malignant neoplasm of breast: Secondary | ICD-10-CM

## 2021-09-09 DIAGNOSIS — K222 Esophageal obstruction: Secondary | ICD-10-CM

## 2021-09-09 DIAGNOSIS — Z23 Encounter for immunization: Secondary | ICD-10-CM | POA: Diagnosis not present

## 2021-09-09 DIAGNOSIS — K449 Diaphragmatic hernia without obstruction or gangrene: Secondary | ICD-10-CM | POA: Insufficient documentation

## 2021-09-09 MED ORDER — OMEPRAZOLE 20 MG PO CPDR: 20.0000 mg | DELAYED_RELEASE_CAPSULE | Freq: Two times a day (BID) | ORAL | 3 refills | Status: DC

## 2021-09-09 MED ORDER — SYRINGE (DISPOSABLE) 1 ML MISC: 0 refills | Status: DC

## 2021-09-09 MED ORDER — BISOPROLOL-HYDROCHLOROTHIAZIDE 5-6.25 MG PO TABS: 2.0000 | ORAL_TABLET | Freq: Every day | ORAL | 3 refills | Status: DC

## 2021-09-09 MED ORDER — CYANOCOBALAMIN 1000 MCG/ML IJ SOLN: INTRAMUSCULAR | 0 refills | Status: DC

## 2021-09-09 NOTE — Progress Notes (Signed)
Date:  09/09/2021   Name:  Nicole Jacobs   DOB:  1966-04-15   MRN:  [id number]   Chief Complaint: New Patient (Initial Visit)  Hypertension This is a chronic problem. The problem is uncontrolled (has been out of meds for 5 years). Associated symptoms include headaches. Pertinent negatives include no anxiety, blurred vision, chest pain, palpitations, peripheral edema or shortness of breath. Past treatments include beta blockers and diuretics. There is no history of kidney disease, CAD/MI or CVA.  Gastroesophageal Reflux She complains of coughing, dysphagia and a hoarse voice. She reports no chest pain. The problem occurs frequently. Risk factors include hiatal hernia. Treatments tried: on low dose PPI from [employer]. Past procedures include an EGD (requiring dilatation).   Lab Results  Component Value Date   NA 139 05/01/2015   K 3.9 05/01/2015   CO2 31 05/01/2015   GLUCOSE 59 (L) 05/01/2015   BUN 14 05/01/2015   CREATININE 0.76 05/01/2015   CALCIUM 8.9 05/01/2015   Lab Results  Component Value Date   CHOL 154 03/17/2012   HDL 87.20 03/17/2012   LDLCALC 47 03/17/2012   TRIG 97.0 03/17/2012   CHOLHDL 2 03/17/2012   Lab Results  Component Value Date   TSH 0.84 12/11/2013   No results found for: HGBA1C Lab Results  Component Value Date   WBC 4.9 12/11/2013   HGB 12.6 12/11/2013   HCT 37.5 12/11/2013   MCV 89.7 12/11/2013   PLT 325.0 12/11/2013   Lab Results  Component Value Date   ALT 12 05/01/2015   AST 14 05/01/2015   ALKPHOS 74 05/01/2015   BILITOT 0.4 05/01/2015   No results found for: 25OHVITD2, 25OHVITD3, VD25OH   Review of Systems  HENT:  Positive for hoarse voice.   Eyes:  Negative for blurred vision.  Respiratory:  Positive for cough. Negative for shortness of breath.   Cardiovascular:  Negative for chest pain and palpitations.  Gastrointestinal:  Positive for dysphagia.  Neurological:  Positive for headaches.   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  Current Meds  Medication Sig   bisoprolol-hydrochlorothiazide (ZIAC) 5-6.25 MG tablet Take 2 tablets by mouth daily.   cyanocobalamin (,VITAMIN B-12,) 1000 MCG/ML injection For use with monthly B12 injections   mupirocin cream (BACTROBAN) 2 % Apply 1 application topically 3 (three) times daily. Pease fill with ointment ...   Syringe, Disposable, 1 ML MISC For use with B12 injections monthlt    PHQ 2/9 Scores 09/09/2021  PHQ - 2 Score 0  PHQ- 9 Score 1    GAD 7 : Generalized Anxiety Score 09/09/2021  Nervous, Anxious, on Edge 0  Control/stop worrying 0  Worry too much - different things 0  Trouble relaxing 0  Restless 0  Easily annoyed or irritable 0  Afraid - awful might happen 0  Total GAD 7 Score 0  Anxiety Difficulty Not difficult at all    BP Readings from Last 3 Encounters:  09/09/21 (!) 178/98  05/01/15 106/68  02/22/15 135/77    Physical Exam Vitals and nursing note reviewed.  Constitutional:      General: She is not in acute distress.    Appearance: Normal appearance. She is well-developed.  HENT:     Head: Normocephalic and atraumatic.  Neck:     Vascular: No carotid bruit.  Cardiovascular:     Rate and Rhythm: Normal rate and regular rhythm.     Pulses: Normal pulses.     Heart sounds: No murmur heard. Pulmonary:     Effort:  Pulmonary effort is normal. No respiratory distress.     Breath sounds: No wheezing or rhonchi.  Musculoskeletal:     Cervical back: Normal range of motion.     Right lower leg: No edema.     Left lower leg: No edema.  Lymphadenopathy:     Cervical: No cervical adenopathy.  Skin:    General: Skin is warm and dry.     Capillary Refill: Capillary refill takes less than 2 seconds.     Findings: No rash.  Neurological:     General: No focal deficit present.     Mental Status: She is alert and oriented to person, place, and time.  Psychiatric:        Mood and Affect: Mood normal.        Behavior: Behavior normal.    Wt Readings from Last 3 Encounters:  09/09/21 125 lb (56.7 kg)  05/01/15 106 lb (48.1 kg)  02/22/15 115 lb (52.2 kg)    BP (!) 178/98    Pulse (!) 127    Ht 5\' 3"  (1.6 m)    Wt 125 lb (56.7 kg)    LMP 09/18/2013    SpO2 98%    BMI 22.14 kg/m   Assessment and Plan: 1. B12 deficiency Resume monthly dosing - cyanocobalamin (,VITAMIN B-12,) 1000 MCG/ML injection; For use with monthly B12 injections  Dispense: 10 mL; Refill: 0 - Syringe, Disposable, 1 ML MISC; For use with B12 injections monthlt  Dispense: 25 each; Refill: 0  2. Essential hypertension BP not controlled due to lack of medication Resume previous meds and follow up for CPX - bisoprolol-hydrochlorothiazide (ZIAC) 5-6.25 MG tablet; Take 2 tablets by mouth daily.  Dispense: 60 tablet; Refill: 3  3. Encounter for screening mammogram for breast cancer Schedule at [facility] - MM 3D SCREEN BREAST BILATERAL  4. GERD with stricture Increase dose of PPI to bid Reassess at next visit and consider referral to GI - omeprazole (PRILOSEC) 20 MG capsule; Take 1 capsule (20 mg total) by mouth 2 (two) times daily before a meal.  Dispense: 60 capsule; Refill: 3  5. Need for influenza vaccination Given today - Flu Vaccine QUAD 82mo+IM (Fluarix, Fluzone & Alfiuria Quad PF)  6. Hiatal hernia PPI bid as above  Return for CPX  with Pap smear and labs. Partially dictated using 5mo. Any errors are unintentional.  Animal nutritionist, MD [facility]  09/09/2021

## 2021-09-09 NOTE — Telephone Encounter (Signed)
Both refilled today, 09/09/2021. Duplicate request.  Requested Prescriptions  Pending Prescriptions Disp Refills   bisoprolol-hydrochlorothiazide (ZIAC) 5-6.25 MG tablet 60 tablet 3    Sig: Take 2 tablets by mouth daily.     Cardiovascular: Beta Blocker + Diuretic Combos Failed - 09/09/2021  6:33 PM      Failed - K in normal range and within 180 days    Potassium  Date Value Ref Range Status  05/01/2015 3.9 3.5 - 5.1 mEq/L Final         Failed - Na in normal range and within 180 days    Sodium  Date Value Ref Range Status  05/01/2015 139 135 - 145 mEq/L Final         Failed - Cr in normal range and within 180 days    Creatinine, Ser  Date Value Ref Range Status  05/01/2015 0.76 0.40 - 1.20 mg/dL Final         Failed - Ca in normal range and within 180 days    Calcium  Date Value Ref Range Status  05/01/2015 8.9 8.4 - 10.5 mg/dL Final         Failed - Last BP in normal range    BP Readings from Last 1 Encounters:  09/09/21 (!) 178/98         Failed - Last Heart Rate in normal range    Pulse Readings from Last 1 Encounters:  09/09/21 (!) 127         Passed - Patient is not pregnant      Passed - Valid encounter within last 6 months    Recent Outpatient Visits          Today B12 deficiency   [facility] Reubin Milan, MD      Future Appointments            In 1 month Reubin Milan, MD [facility]            cyanocobalamin (,VITAMIN B-12,) 1000 MCG/ML injection 10 mL 0    Sig: For use with monthly B12 injections     Off-Protocol Failed - 09/09/2021  6:33 PM      Failed - Medication not assigned to a protocol, review manually.      Passed - Valid encounter within last 12 months    Recent Outpatient Visits          Today B12 deficiency   [facility] Reubin Milan, MD      Future Appointments            In 1 month Judithann Graves Nyoka Cowden, MD [facility], [facility]          Off-Protocol Failed -  09/09/2021  6:33 PM      Failed - Medication not assigned to a protocol, review manually.      Passed - Valid encounter within last 12 months    Recent Outpatient Visits          Today B12 deficiency   [facility] Reubin Milan, MD      Future Appointments            In 1 month Judithann Graves, Nyoka Cowden, MD [facility], [facility]

## 2021-09-09 NOTE — Telephone Encounter (Signed)
Medication Refill - Medication: bisoprolol-hydrochlorothiazide (ZIAC) 5-6.25 MG tablet and cyanocobalamin (,VITAMIN B-12,) 1000 MCG/ML injection  Has the patient contacted their pharmacy? Yes.    (Agent: If yes, when and what did the pharmacy advise?) Pharmacy did not received script called in today. Patient would like a follow up call when completed  Preferred Pharmacy (with phone number or street name):   [id number] - [place], [place] - [address] [place] [address] AT [facility] [address] Phone:  [phone]  Fax:  [phone]       Has the patient been seen for an appointment in the last year OR does the patient have an upcoming appointment? Yes.    Agent: Please be advised that RX refills may take up to 3 business days. We ask that you follow-up with your pharmacy.

## 2021-10-14 ENCOUNTER — Ambulatory Visit
Admission: RE | Admit: 2021-10-14 | Discharge: 2021-10-14 | Disposition: A | Discharge status: Home / Self Care | Payer: Medicaid Other | Source: Ambulatory Visit | Attending: Internal Medicine | Admitting: Internal Medicine

## 2021-10-14 ENCOUNTER — Other Ambulatory Visit: Payer: Self-pay

## 2021-10-14 DIAGNOSIS — Z1231 Encounter for screening mammogram for malignant neoplasm of breast: Secondary | ICD-10-CM | POA: Insufficient documentation

## 2021-10-28 ENCOUNTER — Encounter: Payer: Self-pay | Admitting: Internal Medicine

## 2021-10-28 ENCOUNTER — Other Ambulatory Visit: Payer: Self-pay

## 2021-10-28 ENCOUNTER — Ambulatory Visit (INDEPENDENT_AMBULATORY_CARE_PROVIDER_SITE_OTHER): Payer: Medicaid Other | Admitting: Internal Medicine

## 2021-10-28 ENCOUNTER — Other Ambulatory Visit: Payer: Self-pay | Admitting: Internal Medicine

## 2021-10-28 ENCOUNTER — Other Ambulatory Visit (HOSPITAL_COMMUNITY)
Admission: RE | Admit: 2021-10-28 | Discharge: 2021-10-28 | Disposition: A | Discharge status: Home / Self Care | Payer: Medicaid Other | Source: Ambulatory Visit | Attending: Internal Medicine | Admitting: Internal Medicine

## 2021-10-28 VITALS — BP 136/88 | HR 68 | Ht 63.0 in | Wt 117.4 lb

## 2021-10-28 DIAGNOSIS — R8271 Bacteriuria: Secondary | ICD-10-CM

## 2021-10-28 DIAGNOSIS — Z0000 Encounter for general adult medical examination without abnormal findings: Secondary | ICD-10-CM | POA: Diagnosis not present

## 2021-10-28 DIAGNOSIS — K219 Gastro-esophageal reflux disease without esophagitis: Secondary | ICD-10-CM | POA: Diagnosis not present

## 2021-10-28 DIAGNOSIS — Z124 Encounter for screening for malignant neoplasm of cervix: Secondary | ICD-10-CM | POA: Diagnosis not present

## 2021-10-28 DIAGNOSIS — Z1211 Encounter for screening for malignant neoplasm of colon: Secondary | ICD-10-CM | POA: Diagnosis not present

## 2021-10-28 DIAGNOSIS — I10 Essential (primary) hypertension: Secondary | ICD-10-CM | POA: Diagnosis not present

## 2021-10-28 DIAGNOSIS — Z1159 Encounter for screening for other viral diseases: Secondary | ICD-10-CM | POA: Diagnosis not present

## 2021-10-28 DIAGNOSIS — K222 Esophageal obstruction: Secondary | ICD-10-CM

## 2021-10-28 DIAGNOSIS — E538 Deficiency of other specified B group vitamins: Secondary | ICD-10-CM

## 2021-10-28 LAB — POCT URINALYSIS DIPSTICK
Bilirubin, UA: NEGATIVE
Glucose, UA: NEGATIVE
Ketones, UA: NEGATIVE
Leukocytes, UA: NEGATIVE
Nitrite, UA: POSITIVE
Protein, UA: NEGATIVE
Spec Grav, UA: >=1.03 — AB (ref 1.010–1.025)
Urobilinogen, UA: 0.2 E.U./dL
pH, UA: 6 (ref 5.0–8.0)

## 2021-10-28 NOTE — Progress Notes (Addendum)
Date:  10/28/2021   Name:  Nicole Jacobs   DOB:  [birth year]   MRN:  [id number]   Chief Complaint: Annual Exam (Breast exam with pap ) Nicole Jacobs is a 56 y.o. female who presents today for her Complete Annual Exam. She feels well. She reports exercising walking 2-3 days a week. She reports she is sleeping well. Breast complaints none.  Mammogram: 09/2021 DEXA: none Pap smear: 05/2011 Colonoscopy: none  Immunization History  Administered Date(s) Administered   Influenza,inj,Quad PF,6+ Mos 09/09/2021    Hypertension This is a chronic problem. The problem has been gradually improving since onset. The problem is controlled. Pertinent negatives include no chest pain, headaches, palpitations or shortness of breath. Past treatments include beta blockers and diuretics. There is no history of kidney disease, CAD/MI or CVA.  Gastroesophageal Reflux She reports no abdominal pain, no chest pain, no coughing, no dysphagia, no heartburn, no hoarse voice or no wheezing. This is a recurrent problem. The problem occurs rarely. Pertinent negatives include no fatigue. She has tried a PPI (increased dose to bid last visit) for the symptoms. The treatment provided significant relief. Past procedures include an EGD (and dilatation).   Lab Results  Component Value Date   NA 139 05/01/2015   K 3.9 05/01/2015   CO2 31 05/01/2015   GLUCOSE 59 (L) 05/01/2015   BUN 14 05/01/2015   CREATININE 0.76 05/01/2015   CALCIUM 8.9 05/01/2015   Lab Results  Component Value Date   CHOL 154 03/17/2012   HDL 87.20 03/17/2012   LDLCALC 47 03/17/2012   TRIG 97.0 03/17/2012   CHOLHDL 2 03/17/2012   Lab Results  Component Value Date   TSH 0.84 12/11/2013   No results found for: HGBA1C Lab Results  Component Value Date   WBC 4.9 12/11/2013   HGB 12.6 12/11/2013   HCT 37.5 12/11/2013   MCV 89.7 12/11/2013   PLT 325.0 12/11/2013   Lab Results  Component Value Date   ALT 12 05/01/2015   AST 14  05/01/2015   ALKPHOS 74 05/01/2015   BILITOT 0.4 05/01/2015   No results found for: 25OHVITD2, 25OHVITD3, VD25OH   Review of Systems  Constitutional:  Negative for chills, fatigue and fever.  HENT:  Negative for congestion, hearing loss, hoarse voice, tinnitus, trouble swallowing and voice change.   Eyes:  Negative for visual disturbance.  Respiratory:  Negative for cough, chest tightness, shortness of breath and wheezing.   Cardiovascular:  Negative for chest pain, palpitations and leg swelling.  Gastrointestinal:  Negative for abdominal pain, constipation, diarrhea, dysphagia, heartburn and vomiting.  Endocrine: Negative for polydipsia and polyuria.  Genitourinary:  Negative for dysuria, frequency, genital sores, vaginal bleeding and vaginal discharge.  Musculoskeletal:  Negative for arthralgias, gait problem and joint swelling.  Skin:  Negative for color change and rash.  Neurological:  Negative for dizziness, tremors, light-headedness and headaches.  Hematological:  Negative for adenopathy. Does not bruise/bleed easily.  Psychiatric/Behavioral:  Negative for dysphoric mood and sleep disturbance. The patient is not nervous/anxious.    Patient Active Problem List   Diagnosis Date Noted   B12 deficiency 09/09/2021   Hiatal hernia 09/09/2021   GERD with stricture 09/09/2021   Neck pain on right side 01/11/2014   Essential hypertension 03/19/2012    Allergies  Allergen Reactions   Penicillins Rash    Past Surgical History:  Procedure Laterality Date   NO PAST SURGERIES      Social History   Tobacco  Use   Smoking status: Never   Smokeless tobacco: Never  Vaping Use   Vaping Use: Never used  Substance Use Topics   Alcohol use: Yes    Alcohol/week: 1.0 standard drink    Types: 1 Standard drinks or equivalent per week    Comment: rare   Drug use: No     Medication list has been reviewed and updated.  Current Meds  Medication Sig   bisoprolol-hydrochlorothiazide  (ZIAC) 5-6.25 MG tablet Take 2 tablets by mouth daily.   cyanocobalamin (,VITAMIN B-12,) 1000 MCG/ML injection For use with monthly B12 injections   omeprazole (PRILOSEC) 20 MG capsule Take 1 capsule (20 mg total) by mouth 2 (two) times daily before a meal.   Syringe, Disposable, 1 ML MISC For use with B12 injections monthlt   [DISCONTINUED] mupirocin cream (BACTROBAN) 2 % Apply 1 application topically 3 (three) times daily. Pease fill with ointment ...    PHQ 2/9 Scores 10/28/2021 09/09/2021  PHQ - 2 Score 0 0  PHQ- 9 Score 1 1    GAD 7 : Generalized Anxiety Score 10/28/2021 09/09/2021  Nervous, Anxious, on Edge 1 0  Control/stop worrying 0 0  Worry too much - different things 1 0  Trouble relaxing 1 0  Restless 0 0  Easily annoyed or irritable 0 0  Afraid - awful might happen 0 0  Total GAD 7 Score 3 0  Anxiety Difficulty Not difficult at all Not difficult at all    BP Readings from Last 3 Encounters:  10/28/21 136/88  09/09/21 (!) 178/98  05/01/15 106/68    Physical Exam Vitals and nursing note reviewed.  Constitutional:      General: She is not in acute distress.    Appearance: She is well-developed.  HENT:     Head: Normocephalic and atraumatic.     Right Ear: Tympanic membrane and ear canal normal.     Left Ear: Tympanic membrane and ear canal normal.     Nose:     Right Sinus: No maxillary sinus tenderness.     Left Sinus: No maxillary sinus tenderness.  Eyes:     General: No scleral icterus.       Right eye: No discharge.        Left eye: No discharge.     Conjunctiva/sclera: Conjunctivae normal.  Neck:     Thyroid: No thyromegaly.     Vascular: No carotid bruit.  Cardiovascular:     Rate and Rhythm: Normal rate and regular rhythm.     Pulses: Normal pulses.     Heart sounds: Normal heart sounds.  Pulmonary:     Effort: Pulmonary effort is normal. No respiratory distress.     Breath sounds: No wheezing.  Chest:  Breasts:    Right: No mass, nipple  discharge, skin change or tenderness.     Left: No mass, nipple discharge, skin change or tenderness.  Abdominal:     General: Abdomen is flat. Bowel sounds are normal.     Palpations: Abdomen is soft.     Tenderness: There is no abdominal tenderness. There is no right CVA tenderness, left CVA tenderness, guarding or rebound.  Genitourinary:    Labia:        Right: No tenderness, lesion or injury.        Left: No tenderness, lesion or injury.      Vagina: Normal.     Cervix: Normal.     Uterus: Normal.      Adnexa:  Right adnexa normal and left adnexa normal.     Comments: Pap obtained Musculoskeletal:     Cervical back: Normal range of motion. No erythema.     Right lower leg: No edema.     Left lower leg: No edema.  Lymphadenopathy:     Cervical: No cervical adenopathy.  Skin:    General: Skin is warm and dry.     Findings: No rash.  Neurological:     Mental Status: She is alert and oriented to person, place, and time.     Cranial Nerves: No cranial nerve deficit.     Sensory: No sensory deficit.     Deep Tendon Reflexes: Reflexes are normal and symmetric.  Psychiatric:        Attention and Perception: Attention normal.        Mood and Affect: Mood normal.    Wt Readings from Last 3 Encounters:  10/28/21 117 lb 6.4 oz (53.3 kg)  09/09/21 125 lb (56.7 kg)  05/01/15 106 lb (48.1 kg)    BP 136/88    Pulse 68    Ht 5\' 3"  (1.6 m)    Wt 117 lb 6.4 oz (53.3 kg)    LMP 09/18/2013    SpO2 97%    BMI 20.80 kg/m   Assessment and Plan: 1. Annual physical exam Normal exam. Continue healthy diet and exercise - Lipid panel  2. Encounter for screening for cervical cancer Pap obtained today - Cytology - PAP  3. Colon cancer screening - Ambulatory referral to Gastroenterology  4. Need for hepatitis C screening test - Hepatitis C antibody  5. Essential hypertension Clinically stable exam with well controlled BP. Tolerating medications without side effects at this time. Pt  to continue current regimen and low sodium diet; benefits of regular exercise as able discussed. - Comprehensive metabolic panel - TSH - POCT urinalysis dipstick - positive for bacteria and few RBCs - will get culture  6. GERD with stricture Symptoms much improved on twice daily PPI Continue current therapy - CBC with Differential/Platelet  7. B12 deficiency Continue monthly self injections - Vitamin B12   Partially dictated using 09/20/2013. Any errors are unintentional.  Animal nutritionist, MD [facility]  10/28/2021

## 2021-10-28 NOTE — Addendum Note (Signed)
Addended by: Reubin Milan on: 10/28/2021 11:36 AM ? ? Modules accepted: Orders ? ?

## 2021-10-29 ENCOUNTER — Other Ambulatory Visit: Payer: Self-pay

## 2021-10-29 DIAGNOSIS — Z1211 Encounter for screening for malignant neoplasm of colon: Secondary | ICD-10-CM

## 2021-10-29 LAB — CBC WITH DIFFERENTIAL/PLATELET
Basophils Absolute: 0.1 10*3/uL (ref 0.0–0.2)
Basos: 1 %
EOS (ABSOLUTE): 0.1 10*3/uL (ref 0.0–0.4)
Eos: 1 %
Hematocrit: 35.8 % (ref 34.0–46.6)
Hemoglobin: 11.8 g/dL (ref 11.1–15.9)
Immature Grans (Abs): 0 10*3/uL (ref 0.0–0.1)
Immature Granulocytes: 0 %
Lymphocytes Absolute: 1.6 10*3/uL (ref 0.7–3.1)
Lymphs: 18 %
MCH: 26.6 pg (ref 26.6–33.0)
MCHC: 33 g/dL (ref 31.5–35.7)
MCV: 81 fL (ref 79–97)
Monocytes Absolute: 0.9 10*3/uL (ref 0.1–0.9)
Monocytes: 10 %
Neutrophils Absolute: 6.1 10*3/uL (ref 1.4–7.0)
Neutrophils: 70 %
Platelets: 444 10*3/uL (ref 150–450)
RBC: 4.44 x10E6/uL (ref 3.77–5.28)
RDW: 16.3 % — ABNORMAL HIGH (ref 11.7–15.4)
WBC: 8.7 10*3/uL (ref 3.4–10.8)

## 2021-10-29 LAB — COMPREHENSIVE METABOLIC PANEL
ALT: 27 IU/L (ref 0–32)
AST: 29 IU/L (ref 0–40)
Albumin/Globulin Ratio: 1.3 (ref 1.2–2.2)
Albumin: 4.3 g/dL (ref 3.8–4.9)
Alkaline Phosphatase: 90 IU/L (ref 44–121)
BUN/Creatinine Ratio: 10 (ref 9–23)
BUN: 9 mg/dL (ref 6–24)
Bilirubin Total: 0.9 mg/dL (ref 0.0–1.2)
CO2: 22 mmol/L (ref 20–29)
Calcium: 10 mg/dL (ref 8.7–10.2)
Chloride: 101 mmol/L (ref 96–106)
Creatinine, Ser: 0.86 mg/dL (ref 0.57–1.00)
Globulin, Total: 3.2 g/dL (ref 1.5–4.5)
Glucose: 91 mg/dL (ref 70–99)
Potassium: 4.7 mmol/L (ref 3.5–5.2)
Sodium: 139 mmol/L (ref 134–144)
Total Protein: 7.5 g/dL (ref 6.0–8.5)
eGFR: 80 mL/min/{1.73_m2} (ref >59)

## 2021-10-29 LAB — HEPATITIS C ANTIBODY: Hep C Virus Ab: NONREACTIVE

## 2021-10-29 LAB — VITAMIN B12: Vitamin B-12: 433 pg/mL (ref 232–1245)

## 2021-10-29 LAB — LIPID PANEL
Chol/HDL Ratio: 1.8 ratio (ref 0.0–4.4)
Cholesterol, Total: 178 mg/dL (ref 100–199)
HDL: 100 mg/dL (ref >39)
LDL Chol Calc (NIH): 65 mg/dL (ref 0–99)
Triglycerides: 71 mg/dL (ref 0–149)
VLDL Cholesterol Cal: 13 mg/dL (ref 5–40)

## 2021-10-29 LAB — CYTOLOGY - PAP
Comment: NEGATIVE
Diagnosis: NEGATIVE
High risk HPV: NEGATIVE

## 2021-10-29 LAB — TSH: TSH: 0.575 u[IU]/mL (ref 0.450–4.500)

## 2021-10-29 MED ORDER — NA SULFATE-K SULFATE-MG SULF 17.5-3.13-1.6 GM/177ML PO SOLN: 1.0000 | Freq: Once | ORAL | 0 refills | Status: AC

## 2021-10-29 NOTE — Progress Notes (Signed)
Gastroenterology Pre-Procedure Review ? ?Request Date: 11/13/2021 ?Requesting Physician: Dr. Marius Ditch ? ?PATIENT REVIEW QUESTIONS: The patient responded to the following health history questions as indicated:   ? ?1. Are you having any GI issues? no ?2. Do you have a personal history of Polyps? yes (last sigmoid ) ?3. Do you have a family history of Colon Cancer or Polyps? yes (colon cancer grandmother ) ?4. Diabetes Mellitus? no ?5. Joint replacements in the past 12 months?no ?6. Major health problems in the past 3 months?no ?7. Any artificial heart valves, MVP, or defibrillator?no ?   ?MEDICATIONS & ALLERGIES:    ?Patient reports the following regarding taking any anticoagulation/antiplatelet therapy:   ?Plavix, Coumadin, Eliquis, Xarelto, Lovenox, Pradaxa, Brilinta, or Effient? no ?Aspirin? no ? ?Patient confirms/reports the following medications:  ?Current Outpatient Medications  ?Medication Sig Dispense Refill  ? bisoprolol-hydrochlorothiazide (ZIAC) 5-6.25 MG tablet Take 2 tablets by mouth daily. 60 tablet 3  ? cyanocobalamin (,VITAMIN B-12,) 1000 MCG/ML injection For use with monthly B12 injections 10 mL 0  ? omeprazole (PRILOSEC) 20 MG capsule Take 1 capsule (20 mg total) by mouth 2 (two) times daily before a meal. 60 capsule 3  ? Syringe, Disposable, 1 ML MISC For use with B12 injections monthlt 25 each 0  ? ?No current facility-administered medications for this visit.  ? ? ?Patient confirms/reports the following allergies:  ?Allergies  ?Allergen Reactions  ? Penicillins Rash  ? ? ?No orders of the defined types were placed in this encounter. ? ? ?AUTHORIZATION INFORMATION ?Primary Insurance: ?1D#: ?Group #: ? ?Secondary Insurance: ?1D#: ?Group #: ? ?SCHEDULE INFORMATION: ?Date: 11/13/2021 ?Time: ?Location:[facility] ? ?

## 2021-11-01 LAB — URINE CULTURE

## 2021-11-02 ENCOUNTER — Other Ambulatory Visit: Payer: Self-pay | Admitting: Internal Medicine

## 2021-11-02 DIAGNOSIS — N3001 Acute cystitis with hematuria: Secondary | ICD-10-CM

## 2021-11-02 MED ORDER — SULFAMETHOXAZOLE-TRIMETHOPRIM 800-160 MG PO TABS: 1.0000 | ORAL_TABLET | Freq: Two times a day (BID) | ORAL | 0 refills | Status: AC

## 2021-11-05 ENCOUNTER — Encounter: Payer: Self-pay | Admitting: Gastroenterology

## 2021-11-13 ENCOUNTER — Ambulatory Visit: Payer: Medicaid Other | Admitting: Anesthesiology

## 2021-11-13 ENCOUNTER — Encounter
Admission: RE | Disposition: A | Discharge status: Home / Self Care | Payer: Self-pay | Source: Home / Self Care | Attending: Gastroenterology

## 2021-11-13 ENCOUNTER — Ambulatory Visit
Admission: RE | Admit: 2021-11-13 | Discharge: 2021-11-13 | Disposition: A | Discharge status: Home / Self Care | Payer: Medicaid Other | Attending: Gastroenterology | Admitting: Gastroenterology

## 2021-11-13 ENCOUNTER — Other Ambulatory Visit: Payer: Self-pay

## 2021-11-13 ENCOUNTER — Encounter: Payer: Self-pay | Admitting: Gastroenterology

## 2021-11-13 DIAGNOSIS — E669 Obesity, unspecified: Secondary | ICD-10-CM | POA: Diagnosis not present

## 2021-11-13 DIAGNOSIS — I10 Essential (primary) hypertension: Secondary | ICD-10-CM | POA: Diagnosis not present

## 2021-11-13 DIAGNOSIS — Z1211 Encounter for screening for malignant neoplasm of colon: Secondary | ICD-10-CM | POA: Insufficient documentation

## 2021-11-13 DIAGNOSIS — K219 Gastro-esophageal reflux disease without esophagitis: Secondary | ICD-10-CM | POA: Diagnosis not present

## 2021-11-13 HISTORY — PX: COLONOSCOPY WITH PROPOFOL: SHX5780

## 2021-11-13 SURGERY — COLONOSCOPY WITH PROPOFOL
Anesthesia: General | Site: Rectum

## 2021-11-13 MED ORDER — PROPOFOL 500 MG/50ML IV EMUL
INTRAVENOUS | Status: DC | PRN
  Administered 2021-11-13: 120 ug/kg/min via INTRAVENOUS

## 2021-11-13 MED ORDER — STERILE WATER FOR IRRIGATION IR SOLN
Status: DC | PRN
  Administered 2021-11-13: 200 mL

## 2021-11-13 MED ORDER — PROPOFOL 10 MG/ML IV BOLUS
INTRAVENOUS | Status: DC | PRN
  Administered 2021-11-13: 40 mg via INTRAVENOUS
  Administered 2021-11-13: 30 mg via INTRAVENOUS
  Administered 2021-11-13: 70 mg via INTRAVENOUS

## 2021-11-13 MED ORDER — ONDANSETRON HCL 4 MG/2ML IJ SOLN: 4.0000 mg | Freq: Once | INTRAMUSCULAR | Status: DC | PRN

## 2021-11-13 MED ORDER — ACETAMINOPHEN 160 MG/5ML PO SOLN: 325.0000 mg | ORAL | Status: DC | PRN

## 2021-11-13 MED ORDER — DEXMEDETOMIDINE (PRECEDEX) IN NS 20 MCG/5ML (4 MCG/ML) IV SYRINGE
PREFILLED_SYRINGE | INTRAVENOUS | Status: DC | PRN
  Administered 2021-11-13 (×2): 5 ug via INTRAVENOUS

## 2021-11-13 MED ORDER — SODIUM CHLORIDE 0.9 % IV SOLN: INTRAVENOUS | Status: DC

## 2021-11-13 MED ORDER — ACETAMINOPHEN 325 MG PO TABS: 325.0000 mg | ORAL_TABLET | ORAL | Status: DC | PRN

## 2021-11-13 MED ORDER — STERILE WATER FOR IRRIGATION IR SOLN
Status: DC | PRN
  Administered 2021-11-13: 60 mL

## 2021-11-13 MED ORDER — LACTATED RINGERS IV SOLN: INTRAVENOUS | Status: DC

## 2021-11-13 SURGICAL SUPPLY — 26 items
CLIP HMST 235XBRD CATH ROT (MISCELLANEOUS) IMPLANT
CLIP RESOLUTION 360 11X235 (MISCELLANEOUS)
ELECT REM PT RETURN 9FT ADLT (ELECTROSURGICAL)
ELECTRODE REM PT RTRN 9FT ADLT (ELECTROSURGICAL) IMPLANT
FCP ESCP3.2XJMB 240X2.8X (MISCELLANEOUS)
FORCEPS BIOP RAD 4 LRG CAP 4 (CUTTING FORCEPS) IMPLANT
FORCEPS BIOP RJ4 240 W/NDL (MISCELLANEOUS)
FORCEPS ESCP3.2XJMB 240X2.8X (MISCELLANEOUS) IMPLANT
GOWN CVR UNV OPN BCK APRN NK (MISCELLANEOUS) ×2 IMPLANT
GOWN ISOL THUMB LOOP REG UNIV (MISCELLANEOUS) ×4
INJECTOR VARIJECT VIN23 (MISCELLANEOUS) IMPLANT
KIT DEFENDO VALVE AND CONN (KITS) IMPLANT
KIT PRC NS LF DISP ENDO (KITS) ×1 IMPLANT
KIT PROCEDURE OLYMPUS (KITS) ×2
MANIFOLD NEPTUNE II (INSTRUMENTS) ×2 IMPLANT
MARKER SPOT ENDO TATTOO 5ML (MISCELLANEOUS) IMPLANT
PROBE APC STR FIRE (PROBE) IMPLANT
RETRIEVER NET ROTH 2.5X230 LF (MISCELLANEOUS) IMPLANT
SNARE COLD EXACTO (MISCELLANEOUS) IMPLANT
SNARE SHORT THROW 13M SML OVAL (MISCELLANEOUS) IMPLANT
SNARE SHORT THROW 30M LRG OVAL (MISCELLANEOUS) IMPLANT
SNARE SNG USE RND 15MM (INSTRUMENTS) IMPLANT
SPOT EX ENDOSCOPIC TATTOO (MISCELLANEOUS)
TRAP ETRAP POLY (MISCELLANEOUS) IMPLANT
VARIJECT INJECTOR VIN23 (MISCELLANEOUS)
WATER STERILE IRR 250ML POUR (IV SOLUTION) ×2 IMPLANT

## 2021-11-13 NOTE — Transfer of Care (Signed)
Immediate Anesthesia Transfer of Care Note ? ?Patient: Nicole Jacobs ? ?Procedure(s) Performed: COLONOSCOPY WITH PROPOFOL (Rectum) ? ?Patient Location: PACU ? ?Anesthesia Type: General ? ?Level of Consciousness: awake, alert  and patient cooperative ? ?Airway and Oxygen Therapy: Patient Spontanous Breathing and Patient connected to supplemental oxygen ? ?Post-op Assessment: Post-op Vital signs reviewed, Patient's Cardiovascular Status Stable, Respiratory Function Stable, Patent Airway and No signs of Nausea or vomiting ? ?Post-op Vital Signs: Reviewed and stable ? ?Complications: No notable events documented. ? ?

## 2021-11-13 NOTE — Anesthesia Postprocedure Evaluation (Signed)
Anesthesia Post Note ? ?Patient: Nicole Jacobs ? ?Procedure(s) Performed: COLONOSCOPY WITH PROPOFOL (Rectum) ? ? ?  ?Patient location during evaluation: PACU ?Anesthesia Type: General ?Level of consciousness: awake ?Pain management: pain level controlled ?Vital Signs Assessment: post-procedure vital signs reviewed and stable ?Respiratory status: respiratory function stable ?Cardiovascular status: stable ?Postop Assessment: no signs of nausea or vomiting ?Anesthetic complications: no ? ? ?No notable events documented. ? ?Jola Babinski ? ? ? ? ? ?

## 2021-11-13 NOTE — Anesthesia Procedure Notes (Signed)
Date/Time: 11/13/2021 8:39 AM ?Performed by: Lily Kocher, CRNA ?Pre-anesthesia Checklist: Patient identified, Emergency Drugs available, Suction available, Patient being monitored and Timeout performed ?Patient Re-evaluated:Patient Re-evaluated prior to induction ?Oxygen Delivery Method: Nasal cannula ?Induction Type: IV induction ?Placement Confirmation: positive ETCO2 ? ? ? ? ?

## 2021-11-13 NOTE — Op Note (Signed)
[facility] ?Gastroenterology ?Patient Name: Nicole MassedJeanne Pilling ?Procedure Date: 11/13/2021 8:34 AM ?MRN: [id number] ?Account #: [id number] ?Date of Birth: Jan 30, 1966 ?Admit Type: Outpatient ?Age: 56 ?[facility] ?Gender: Female ?Note Status: Finalized ?Instrument Name: Peds [id number] ?Procedure:             Colonoscopy ?Indications:           Screening for colorectal malignant neoplasm, This is  ?                       the patient's first colonoscopy ?Providers:             Toney Reilohini Reddy Amika Tassin MD, MD ?Referring MD:          Bari EdwardLaura Berglund, MD (Referring MD) ?Medicines:             General Anesthesia ?Complications:         No immediate complications. Estimated blood loss: None. ?Procedure:             Pre-Anesthesia Assessment: ?                       - Prior to the procedure, a History and Physical was  ?                       performed, and patient medications and allergies were  ?                       reviewed. The patient is competent. The risks and  ?                       benefits of the procedure and the sedation options and  ?                       risks were discussed with the patient. All questions  ?                       were answered and informed consent was obtained.  ?                       Patient identification and proposed procedure were  ?                       verified by the physician, the nurse, the  ?                       anesthesiologist, the anesthetist and the technician  ?                       in the pre-procedure area in the procedure room in the  ?                       endoscopy suite. Mental Status Examination: alert and  ?                       oriented. Airway Examination: normal oropharyngeal  ?                       airway and neck mobility. Respiratory Examination:  ?  clear to auscultation. CV Examination: normal.  ?                       Prophylactic Antibiotics: The patient does not require  ?                       prophylactic  antibiotics. Prior Anticoagulants: The  ?                       patient has taken no previous anticoagulant or  ?                       antiplatelet agents. ASA Grade Assessment: II - A  ?                       patient with mild systemic disease. After reviewing  ?                       the risks and benefits, the patient was deemed in  ?                       satisfactory condition to undergo the procedure. The  ?                       anesthesia plan was to use general anesthesia.  ?                       Immediately prior to administration of medications,  ?                       the patient was re-assessed for adequacy to receive  ?                       sedatives. The heart rate, respiratory rate, oxygen  ?                       saturations, blood pressure, adequacy of pulmonary  ?                       ventilation, and response to care were monitored  ?                       throughout the procedure. The physical status of the  ?                       patient was re-assessed after the procedure. ?                       After obtaining informed consent, the colonoscope was  ?                       passed under direct vision. Throughout the procedure,  ?                       the patient's blood pressure, pulse, and oxygen  ?                       saturations were monitored continuously. The was  ?  introduced through the anus and advanced to the the  ?                       cecum, identified by appendiceal orifice and ileocecal  ?                       valve. The colonoscopy was performed without  ?                       difficulty. The patient tolerated the procedure well.  ?                       The quality of the bowel preparation was evaluated  ?                       using the BBPS [facility] Bowel Preparation Scale) with  ?                       scores of: Right Colon = 3, Transverse Colon = 3 and  ?                       Left Colon = 3 (entire mucosa seen well with no  ?                        residual staining, small fragments of stool or opaque  ?                       liquid). The total BBPS score equals 9. ?Findings: ?     The perianal and digital rectal examinations were normal. Pertinent  ?     negatives include normal sphincter tone and no palpable rectal lesions. ?     The entire examined colon appeared normal. ?     The retroflexed view of the distal rectum and anal verge was normal and  ?     showed no anal or rectal abnormalities. ?     The terminal ileum appeared normal. ?Impression:            - The entire examined colon is normal. ?                       - The distal rectum and anal verge are normal on  ?                       retroflexion view. ?                       - The examined portion of the ileum was normal. ?                       - No specimens collected. ?Recommendation:        - Discharge patient to home (with escort). ?                       - Resume previous diet today. ?                       - Continue present medications. ?                       -  Repeat colonoscopy in 10 years for screening  ?                       purposes. ?Procedure Code(s):     --- Professional --- ?                       Q6578, Colorectal cancer screening; colonoscopy on  ?                       individual not meeting criteria for high risk ?Diagnosis Code(s):     --- Professional --- ?                       Z12.11, Encounter for screening for malignant neoplasm  ?                       of colon ?CPT copyright 2019 American Medical Association. All rights reserved. ?The codes documented in this report are preliminary and upon coder review may  ?be revised to meet current compliance requirements. ?Dr. Libby Maw ?Elster Corbello Alger Memos MD, MD ?11/13/2021 8:59:35 AM ?This report has been signed electronically. ?Number of Addenda: 0 ?Note Initiated On: 11/13/2021 8:34 AM ?Scope Withdrawal Time: 0 hours 9 minutes 50 seconds  ?Total Procedure Duration: 0 hours 11 minutes 55 seconds  ?Estimated Blood  Loss:  Estimated blood loss: none. Estimated blood loss: none. ?     [facility] ?

## 2021-11-13 NOTE — H&P (Signed)
?Arlyss Repress, MD ?[address]  ?[address]  ?[place], [place] [address]  ?Main: [phone]  ?Fax: [phone] ?Pager: [phone] ? ?Primary Care Physician:  Reubin Milan, MD ?Primary Gastroenterologist:  Dr. Arlyss Repress ? ?Pre-Procedure History & Physical: ?HPI:  Nicole Jacobs is a 56 y.o. female is here for an colonoscopy. ?  ?Past Medical History:  ?Diagnosis Date  ? Anemia   ? B12 deficiency   ? Hypertension   ? Lumbago 12/12/2013  ? Normal MRI lumbar spine 2012.  ? ? ?Past Surgical History:  ?Procedure Laterality Date  ? ESOPHAGOGASTRODUODENOSCOPY    ? SIGMOIDOSCOPY    ? ? ?Prior to Admission medications   ?Medication Sig Start Date End Date Taking? Authorizing Provider  ?bisoprolol-hydrochlorothiazide (ZIAC) 5-6.25 MG tablet Take 2 tablets by mouth daily. 09/09/21  Yes Reubin Milan, MD  ?cyanocobalamin (,VITAMIN B-12,) 1000 MCG/ML injection For use with monthly B12 injections 09/09/21  Yes Reubin Milan, MD  ?omeprazole (PRILOSEC) 20 MG capsule Take 1 capsule (20 mg total) by mouth 2 (two) times daily before a meal. 09/09/21  Yes Reubin Milan, MD  ?Syringe, Disposable, 1 ML MISC For use with B12 injections monthlt 09/09/21   Reubin Milan, MD  ? ? ?Allergies as of 10/29/2021 - Review Complete 10/29/2021  ?Allergen Reaction Noted  ? Penicillins Rash 02/13/2015  ? ? ?Family History  ?Problem Relation Age of Onset  ? Diabetes Mother   ? Lung cancer Mother   ? Hypertension Father   ? Colon cancer Paternal Grandmother   ? Breast cancer Neg Hx   ? ? ?Social History  ? ?Socioeconomic History  ? Marital status: Married  ?  Spouse name: Not on file  ? Number of children: Not on file  ? Years of education: Not on file  ? Highest education level: Not on file  ?Occupational History  ? Not on file  ?Tobacco Use  ? Smoking status: Never  ? Smokeless tobacco: Never  ?Vaping Use  ? Vaping Use: Never used  ?Substance and Sexual Activity  ? Alcohol use: Yes  ?  Alcohol/week: 1.0 standard  drink  ?  Types: 1 Standard drinks or equivalent per week  ?  Comment: rare  ? Drug use: No  ? Sexual activity: Yes  ?  Birth control/protection: None  ?Other Topics Concern  ? Not on file  ?Social History Narrative  ? Not on file  ? ?Social Determinants of Health  ? ?Financial Resource Strain: Not on file  ?Food Insecurity: Not on file  ?Transportation Needs: Not on file  ?Physical Activity: Not on file  ?Stress: Not on file  ?Social Connections: Not on file  ?Intimate Partner Violence: Not on file  ? ? ?Review of Systems: ?See HPI, otherwise negative ROS ? ?Physical Exam: ?BP (!) 141/78   Pulse 68   Temp (!) 97.5 ?F (36.4 ?C) (Temporal)   Ht 5\' 3"  (1.6 m)   Wt 52.9 kg   LMP 09/18/2013   SpO2 99%   BMI 20.67 kg/m?  ?General:   Alert,  pleasant and cooperative in NAD ?Head:  Normocephalic and atraumatic. ?Neck:  Supple; no masses or thyromegaly. ?Lungs:  Clear throughout to auscultation.    ?Heart:  Regular rate and rhythm. ?Abdomen:  Soft, nontender and nondistended. Normal bowel sounds, without guarding, and without rebound.   ?Neurologic:  Alert and  oriented x4;  grossly normal neurologically. ? ?Impression/Plan: ?Nicole Jacobs is here for an colonoscopy  to be performed for colon cancer screening ? ?Risks, benefits, limitations, and alternatives regarding  colonoscopy have been reviewed with the patient.  Questions have been answered.  All parties agreeable. ? ? ?Sherri Sear, MD  11/13/2021, 8:29 AM ?

## 2021-11-13 NOTE — Anesthesia Preprocedure Evaluation (Signed)
Anesthesia Evaluation  ?Patient identified by MRN, date of birth, ID band ?Patient awake ? ? ? ?Reviewed: ?Allergy & Precautions, NPO status  ? ?Airway ?Mallampati: II ? ?TM Distance: >3 FB ? ? ? ? Dental ?  ?Pulmonary ?neg pulmonary ROS,  ?  ?Pulmonary exam normal ? ? ? ? ? ? ? Cardiovascular ?hypertension,  ?Rhythm:Regular Rate:Normal ? ? ?  ?Neuro/Psych ?  ? GI/Hepatic ?hiatal hernia, GERD  ,  ?Endo/Other  ? ? Renal/GU ?  ? ?  ?Musculoskeletal ? ? Abdominal ?  ?Peds ? Hematology ? ?(+) Blood dyscrasia, anemia ,   ?Anesthesia Other Findings ? ? Reproductive/Obstetrics ? ?  ? ? ? ? ? ? ? ? ? ? ? ? ? ?  ?  ? ? ? ? ? ? ? ? ?Anesthesia Physical ?Anesthesia Plan ? ?ASA: 2 ? ?Anesthesia Plan: General  ? ?Post-op Pain Management:   ? ?Induction: Intravenous ? ?PONV Risk Score and Plan: 3 and Propofol infusion, TIVA and Treatment may vary due to age or medical condition ? ?Airway Management Planned: Natural Airway and Nasal Cannula ? ?Additional Equipment:  ? ?Intra-op Plan:  ? ?Post-operative Plan:  ? ?Informed Consent: I have reviewed the patients History and Physical, chart, labs and discussed the procedure including the risks, benefits and alternatives for the proposed anesthesia with the patient or authorized representative who has indicated his/her understanding and acceptance.  ? ? ? ? ? ?Plan Discussed with: CRNA ? ?Anesthesia Plan Comments:   ? ? ? ? ? ? ?Anesthesia Quick Evaluation ? ?

## 2021-11-14 ENCOUNTER — Encounter: Payer: Self-pay | Admitting: Gastroenterology

## 2021-12-20 ENCOUNTER — Other Ambulatory Visit: Payer: Self-pay | Admitting: Internal Medicine

## 2021-12-20 DIAGNOSIS — E538 Deficiency of other specified B group vitamins: Secondary | ICD-10-CM

## 2021-12-22 NOTE — Telephone Encounter (Signed)
Requested Prescriptions  ?Pending Prescriptions Disp Refills  ?? cyanocobalamin (,VITAMIN B-12,) 1000 MCG/ML injection [Pharmacy Med Name: CYANOCOBALAMIN 1000MCG/ML INJ 10ML] 10 mL 0  ?  Sig: FOR USE WITH MONTHLY B12 INJECTIONS  ?  ? Endocrinology:  Vitamins - Vitamin B12 Passed - 12/20/2021  3:32 AM  ?  ?  Passed - HCT in normal range and within 360 days  ?  Hematocrit  ?Date Value Ref Range Status  ?10/28/2021 35.8 34.0 - 46.6 % Final  ?   ?  ?  Passed - HGB in normal range and within 360 days  ?  Hemoglobin  ?Date Value Ref Range Status  ?10/28/2021 11.8 11.1 - 15.9 g/dL Final  ?   ?  ?  Passed - B12 Level in normal range and within 360 days  ?  Vitamin B-12  ?Date Value Ref Range Status  ?10/28/2021 433 232 - 1,245 pg/mL Final  ?   ?  ?  Passed - Valid encounter within last 12 months  ?  Recent Outpatient Visits   ?      ? 1 month ago Annual physical exam  ? [facility] Glean Hess, MD  ? 3 months ago B12 deficiency  ? [facility] Glean Hess, MD  ?  ?  ?Future Appointments   ?        ? In 4 months Army Melia Jesse Sans, MD [facility], [place]  ?  ? ?  ?  ?  ? ?

## 2022-01-20 ENCOUNTER — Other Ambulatory Visit: Payer: Self-pay

## 2022-01-20 DIAGNOSIS — E538 Deficiency of other specified B group vitamins: Secondary | ICD-10-CM

## 2022-01-20 MED ORDER — CYANOCOBALAMIN 1000 MCG/ML IJ SOLN: INTRAMUSCULAR | 0 refills | Status: DC

## 2022-01-26 ENCOUNTER — Other Ambulatory Visit: Payer: Self-pay | Admitting: Internal Medicine

## 2022-01-26 DIAGNOSIS — I10 Essential (primary) hypertension: Secondary | ICD-10-CM

## 2022-01-26 NOTE — Telephone Encounter (Signed)
Copied from CRM [id number]. Topic: General - Other >> Jan 26, 2022  2:23 PM Nicole Jacobs A wrote: Reason for CRM: Medication Refill - Medication: bisoprolol-hydrochlorothiazide (ZIAC) 5-6.25 MG tablet [[id number]] - patient has 0 tablets remaining   Has the patient contacted their pharmacy? Yes.   (Agent: If no, request that the patient contact the pharmacy for the refill. If patient does not wish to contact the pharmacy document the reason why and proceed with request.) (Agent: If yes, when and what did the pharmacy advise?)  Preferred Pharmacy (with phone number or street name): [id number], [place] - [address] AT [facility] OF [address] [place] [address] Phone: [phone] Fax: [phone] Hours: Not open 24 hours  Has the patient been seen for an appointment in the last year OR does the patient have an upcoming appointment? Yes.    Agent: Please be advised that RX refills may take up to 3 business days. We ask that you follow-up with your pharmacy.

## 2022-01-27 NOTE — Telephone Encounter (Signed)
Requested Prescriptions  Pending Prescriptions Disp Refills  . bisoprolol-hydrochlorothiazide (ZIAC) 5-6.25 MG tablet [Pharmacy Med Name: BISOPROLOL/HCTZ 5MG /6.25MG  TABS] 60 tablet 2    Sig: TAKE 2 TABLETS BY MOUTH DAILY     Cardiovascular: Beta Blocker + Diuretic Combos Passed - 01/26/2022  6:21 AM      Passed - K in normal range and within 180 days    Potassium  Date Value Ref Range Status  10/28/2021 4.7 3.5 - 5.2 mmol/L Final         Passed - Na in normal range and within 180 days    Sodium  Date Value Ref Range Status  10/28/2021 139 134 - 144 mmol/L Final         Passed - Cr in normal range and within 180 days    Creatinine, Ser  Date Value Ref Range Status  10/28/2021 0.86 0.57 - 1.00 mg/dL Final         Passed - eGFR in normal range and within 180 days    GFR  Date Value Ref Range Status  05/01/2015 86.37 >60.00 mL/min Final   eGFR  Date Value Ref Range Status  10/28/2021 80 >59 mL/min/1.73 Final         Passed - Last BP in normal range    BP Readings from Last 1 Encounters:  11/13/21 (!) 115/55         Passed - Last Heart Rate in normal range    Pulse Readings from Last 1 Encounters:  11/13/21 65         Passed - Valid encounter within last 6 months    Recent Outpatient Visits          3 months ago Annual physical exam   [facility] Glean Hess, MD   4 months ago B12 deficiency   [facility] Glean Hess, MD      Future Appointments            In 3 months Army Melia Jesse Sans, MD [facility], [facility]

## 2022-01-27 NOTE — Telephone Encounter (Signed)
Refused Ziac 5-6.25 mg because this is a duplicate request.   Rx sent earlier today at 9:40 AM to [facility] pharmacy.

## 2022-02-25 ENCOUNTER — Other Ambulatory Visit: Payer: Self-pay | Admitting: Internal Medicine

## 2022-02-25 DIAGNOSIS — K219 Gastro-esophageal reflux disease without esophagitis: Secondary | ICD-10-CM

## 2022-02-25 NOTE — Telephone Encounter (Signed)
Requested Prescriptions  Pending Prescriptions Disp Refills  . omeprazole (PRILOSEC) 20 MG capsule [Pharmacy Med Name: OMEPRAZOLE 20MG  CAPSULES] 180 capsule 1    Sig: TAKE 1 CAPSULE(20 MG) BY MOUTH TWICE DAILY BEFORE A MEAL     Gastroenterology: Proton Pump Inhibitors Passed - 02/25/2022  8:32 AM      Passed - Valid encounter within last 12 months    Recent Outpatient Visits          4 months ago Annual physical exam   [facility], MD   5 months ago B12 deficiency   [facility], MD      Future Appointments            In 2 months Reubin Milan Judithann Graves, MD [facility], [facility]

## 2022-03-28 ENCOUNTER — Other Ambulatory Visit: Payer: Self-pay | Admitting: Internal Medicine

## 2022-03-28 DIAGNOSIS — I10 Essential (primary) hypertension: Secondary | ICD-10-CM

## 2022-03-30 NOTE — Telephone Encounter (Signed)
Requested Prescriptions  Pending Prescriptions Disp Refills  . bisoprolol-hydrochlorothiazide (ZIAC) 5-6.25 MG tablet [Pharmacy Med Name: BISOPROLOL/HCTZ 5MG /6.25MG  TABS] 180 tablet     Sig: TAKE 2 TABLETS BY MOUTH DAILY     Cardiovascular: Beta Blocker + Diuretic Combos Passed - 03/28/2022  5:02 PM      Passed - K in normal range and within 180 days    Potassium  Date Value Ref Range Status  10/28/2021 4.7 3.5 - 5.2 mmol/L Final         Passed - Na in normal range and within 180 days    Sodium  Date Value Ref Range Status  10/28/2021 139 134 - 144 mmol/L Final         Passed - Cr in normal range and within 180 days    Creatinine, Ser  Date Value Ref Range Status  10/28/2021 0.86 0.57 - 1.00 mg/dL Final         Passed - eGFR in normal range and within 180 days    GFR  Date Value Ref Range Status  05/01/2015 86.37 >60.00 mL/min Final   eGFR  Date Value Ref Range Status  10/28/2021 80 >59 mL/min/1.73 Final         Passed - Last BP in normal range    BP Readings from Last 1 Encounters:  11/13/21 (!) 115/55         Passed - Last Heart Rate in normal range    Pulse Readings from Last 1 Encounters:  11/13/21 65         Passed - Valid encounter within last 6 months    Recent Outpatient Visits          5 months ago Annual physical exam   [facility] Glean Hess, MD   6 months ago B12 deficiency   [facility] Glean Hess, MD      Future Appointments            In 1 month Army Melia Jesse Sans, MD [facility]

## 2022-05-01 ENCOUNTER — Ambulatory Visit: Payer: Medicaid Other | Admitting: Internal Medicine

## 2022-05-01 NOTE — Progress Notes (Deleted)
Date:  05/01/2022   Name:  Nicole Jacobs   DOB:  1966/04/07   MRN:  [id number]   Chief Complaint: No chief complaint on file.  Hypertension This is a chronic problem. The problem is controlled. Past treatments include beta blockers and diuretics. There is no history of kidney disease, CAD/MI or CVA.    Lab Results  Component Value Date   NA 139 10/28/2021   K 4.7 10/28/2021   CO2 22 10/28/2021   GLUCOSE 91 10/28/2021   BUN 9 10/28/2021   CREATININE 0.86 10/28/2021   CALCIUM 10.0 10/28/2021   EGFR 80 10/28/2021   Lab Results  Component Value Date   CHOL 178 10/28/2021   HDL 100 10/28/2021   LDLCALC 65 10/28/2021   TRIG 71 10/28/2021   CHOLHDL 1.8 10/28/2021   Lab Results  Component Value Date   TSH 0.575 10/28/2021   No results found for: "HGBA1C" Lab Results  Component Value Date   WBC 8.7 10/28/2021   HGB 11.8 10/28/2021   HCT 35.8 10/28/2021   MCV 81 10/28/2021   PLT 444 10/28/2021   Lab Results  Component Value Date   ALT 27 10/28/2021   AST 29 10/28/2021   ALKPHOS 90 10/28/2021   BILITOT 0.9 10/28/2021   No results found for: "25OHVITD2", "25OHVITD3", "VD25OH"   Review of Systems  Patient Active Problem List   Diagnosis Date Noted   Colon cancer screening    B12 deficiency 09/09/2021   Hiatal hernia 09/09/2021   GERD with stricture 09/09/2021   Neck pain on right side 01/11/2014   Essential hypertension 03/19/2012    Allergies  Allergen Reactions   Penicillins Rash    Past Surgical History:  Procedure Laterality Date   COLONOSCOPY WITH PROPOFOL N/A 11/13/2021   Procedure: COLONOSCOPY WITH PROPOFOL;  Surgeon: Toney Reil, MD;  Location: [facility];  Service: Endoscopy;  Laterality: N/A;   ESOPHAGOGASTRODUODENOSCOPY     SIGMOIDOSCOPY      Social History   Tobacco Use   Smoking status: Never   Smokeless tobacco: Never  Vaping Use   Vaping Use: Never used  Substance Use Topics   Alcohol use: Yes     Alcohol/week: 1.0 standard drink of alcohol    Types: 1 Standard drinks or equivalent per week    Comment: rare   Drug use: No     Medication list has been reviewed and updated.  No outpatient medications have been marked as taking for the 05/01/22 encounter (Appointment) with Reubin Milan, MD.       10/28/2021   10:43 AM 09/09/2021    3:16 PM  GAD 7 : Generalized Anxiety Score  Nervous, Anxious, on Edge 1 0  Control/stop worrying 0 0  Worry too much - different things 1 0  Trouble relaxing 1 0  Restless 0 0  Easily annoyed or irritable 0 0  Afraid - awful might happen 0 0  Total GAD 7 Score 3 0  Anxiety Difficulty Not difficult at all Not difficult at all       10/28/2021   10:43 AM 09/09/2021    3:16 PM  Depression screen PHQ 2/9  Decreased Interest 0 0  Down, Depressed, Hopeless 0 0  PHQ - 2 Score 0 0  Altered sleeping 0 0  Tired, decreased energy 1 1  Change in appetite 0 0  Feeling bad or failure about yourself  0 0  Trouble concentrating 0 0  Moving slowly  or fidgety/restless 0 0  Suicidal thoughts 0 0  PHQ-9 Score 1 1  Difficult doing work/chores Not difficult at all Not difficult at all    BP Readings from Last 3 Encounters:  11/13/21 (!) 115/55  10/28/21 136/88  09/09/21 (!) 178/98    Physical Exam  Wt Readings from Last 3 Encounters:  11/13/21 116 lb 11.2 oz (52.9 kg)  10/28/21 117 lb 6.4 oz (53.3 kg)  09/09/21 125 lb (56.7 kg)    LMP 09/18/2013   Assessment and Plan:

## 2022-05-20 ENCOUNTER — Ambulatory Visit (INDEPENDENT_AMBULATORY_CARE_PROVIDER_SITE_OTHER): Payer: Medicaid Other | Admitting: Internal Medicine

## 2022-05-20 ENCOUNTER — Encounter: Payer: Self-pay | Admitting: Internal Medicine

## 2022-05-20 VITALS — BP 122/82 | HR 77 | Ht 63.0 in | Wt 118.0 lb

## 2022-05-20 DIAGNOSIS — E538 Deficiency of other specified B group vitamins: Secondary | ICD-10-CM

## 2022-05-20 DIAGNOSIS — I10 Essential (primary) hypertension: Secondary | ICD-10-CM

## 2022-05-20 DIAGNOSIS — R8271 Bacteriuria: Secondary | ICD-10-CM

## 2022-05-20 DIAGNOSIS — Z23 Encounter for immunization: Secondary | ICD-10-CM

## 2022-05-20 DIAGNOSIS — M180 Bilateral primary osteoarthritis of first carpometacarpal joints: Secondary | ICD-10-CM | POA: Diagnosis not present

## 2022-05-20 MED ORDER — SULFAMETHOXAZOLE-TRIMETHOPRIM 800-160 MG PO TABS: 1.0000 | ORAL_TABLET | Freq: Two times a day (BID) | ORAL | 0 refills | Status: AC

## 2022-05-20 MED ORDER — SYRINGE (DISPOSABLE) 1 ML MISC: 0 refills | Status: AC

## 2022-05-20 MED ORDER — CYANOCOBALAMIN 1000 MCG/ML IJ SOLN: INTRAMUSCULAR | 0 refills | Status: DC

## 2022-05-20 MED ORDER — BISOPROLOL-HYDROCHLOROTHIAZIDE 5-6.25 MG PO TABS: 2.0000 | ORAL_TABLET | Freq: Every day | ORAL | 1 refills | Status: DC

## 2022-05-20 NOTE — Progress Notes (Signed)
Date:  05/20/2022   Name:  Nicole Jacobs   DOB:  [birth year]   MRN:  [id number]   Chief Complaint: Hypertension and Recurrent UTI  Hypertension This is a chronic problem. The problem is controlled. Pertinent negatives include no chest pain, headaches or shortness of breath. There are no associated agents to hypertension. Past treatments include diuretics and beta blockers. The current treatment provides significant improvement. There are no compliance problems.  There is no history of kidney disease, CAD/MI or CVA.  Urinary Tract Infection  This is a recurrent problem. Episode onset: several days ago. The problem has been gradually improving. The pain is mild. There has been no fever. Pertinent negatives include no chills or hematuria. Treatments tried: had 3 Bactrim left and took one a day.  Joint pain - both thumbs and index finger on right.  Mild discomfort, finger tips turn white.  No weakness or numbness.  Lab Results  Component Value Date   NA 139 10/28/2021   K 4.7 10/28/2021   CO2 22 10/28/2021   GLUCOSE 91 10/28/2021   BUN 9 10/28/2021   CREATININE 0.86 10/28/2021   CALCIUM 10.0 10/28/2021   EGFR 80 10/28/2021   Lab Results  Component Value Date   CHOL 178 10/28/2021   HDL 100 10/28/2021   LDLCALC 65 10/28/2021   TRIG 71 10/28/2021   CHOLHDL 1.8 10/28/2021   Lab Results  Component Value Date   TSH 0.575 10/28/2021   No results found for: "HGBA1C" Lab Results  Component Value Date   WBC 8.7 10/28/2021   HGB 11.8 10/28/2021   HCT 35.8 10/28/2021   MCV 81 10/28/2021   PLT 444 10/28/2021   Lab Results  Component Value Date   ALT 27 10/28/2021   AST 29 10/28/2021   ALKPHOS 90 10/28/2021   BILITOT 0.9 10/28/2021   No results found for: "25OHVITD2", "25OHVITD3", "VD25OH"   Review of Systems  Constitutional:  Negative for chills, fatigue and fever.  Respiratory:  Negative for chest tightness and shortness of breath.   Cardiovascular:  Negative for chest  pain and leg swelling.  Gastrointestinal:  Negative for abdominal pain.  Genitourinary:  Positive for dysuria. Negative for hematuria.  Musculoskeletal:  Positive for arthralgias, back pain, joint swelling and myalgias.  Neurological:  Negative for dizziness, light-headedness and headaches.  Psychiatric/Behavioral:  Negative for dysphoric mood and sleep disturbance. The patient is not nervous/anxious.     Patient Active Problem List   Diagnosis Date Noted   Colon cancer screening    B12 deficiency 09/09/2021   Hiatal hernia 09/09/2021   GERD with stricture 09/09/2021   Neck pain on right side 01/11/2014   Essential hypertension 03/19/2012    Allergies  Allergen Reactions   Penicillins Rash    Past Surgical History:  Procedure Laterality Date   COLONOSCOPY WITH PROPOFOL N/A 11/13/2021   Procedure: COLONOSCOPY WITH PROPOFOL;  Surgeon: Lin Landsman, MD;  Location: [facility] [facility];  Service: Endoscopy;  Laterality: N/A;   ESOPHAGOGASTRODUODENOSCOPY     SIGMOIDOSCOPY      Social History   Tobacco Use   Smoking status: Never   Smokeless tobacco: Never  Vaping Use   Vaping Use: Never used  Substance Use Topics   Alcohol use: Yes    Alcohol/week: 1.0 standard drink of alcohol    Types: 1 Standard drinks or equivalent per week    Comment: rare   Drug use: No     Medication list has been  reviewed and updated.  Current Meds  Medication Sig   bisoprolol-hydrochlorothiazide (ZIAC) 5-6.25 MG tablet TAKE 2 TABLETS BY MOUTH DAILY   cyanocobalamin (,VITAMIN B-12,) 1000 MCG/ML injection Inject 1 mL into muscle once every month.   omeprazole (PRILOSEC) 20 MG capsule TAKE 1 CAPSULE(20 MG) BY MOUTH TWICE DAILY BEFORE A MEAL   Syringe, Disposable, 1 ML MISC For use with B12 injections monthlt       05/20/2022    3:46 PM 10/28/2021   10:43 AM 09/09/2021    3:16 PM  GAD 7 : Generalized Anxiety Score  Nervous, Anxious, on Edge 1 1 0  Control/stop worrying 1 0 0  Worry  too much - different things 1 1 0  Trouble relaxing 1 1 0  Restless 0 0 0  Easily annoyed or irritable 1 0 0  Afraid - awful might happen 0 0 0  Total GAD 7 Score 5 3 0  Anxiety Difficulty Not difficult at all Not difficult at all Not difficult at all       05/20/2022    3:46 PM 10/28/2021   10:43 AM 09/09/2021    3:16 PM  Depression screen PHQ 2/9  Decreased Interest 2 0 0  Down, Depressed, Hopeless 1 0 0  PHQ - 2 Score 3 0 0  Altered sleeping 0 0 0  Tired, decreased energy _0 Change in appetite 0 0 0  Feeling bad or failure about yourself  0 0 0  Trouble concentrating 0 0 0  Moving slowly or fidgety/restless 0 0 0  Suicidal thoughts 0 0 0  PHQ-9 Score _1 Difficult doing work/chores Not difficult at all Not difficult at all Not difficult at all    BP Readings from Last 3 Encounters:  05/20/22 122/82  11/13/21 (!) 115/55  10/28/21 136/88    Physical Exam Vitals and nursing note reviewed.  Constitutional:      General: She is not in acute distress.    Appearance: Normal appearance. She is well-developed.  HENT:     Head: Normocephalic and atraumatic.  Cardiovascular:     Rate and Rhythm: Normal rate and regular rhythm.  Pulmonary:     Effort: Pulmonary effort is normal. No respiratory distress.     Breath sounds: No wheezing or rhonchi.  Abdominal:     General: Abdomen is flat.     Palpations: Abdomen is soft.     Tenderness: There is no abdominal tenderness.  Musculoskeletal:     Cervical back: Normal range of motion.     Right lower leg: No edema.     Left lower leg: No edema.     Comments: Bony swelling both first CMC joints  Skin:    General: Skin is warm and dry.     Capillary Refill: Capillary refill takes less than 2 seconds.     Findings: No rash.  Neurological:     General: No focal deficit present.     Mental Status: She is alert and oriented to person, place, and time.  Psychiatric:        Mood and Affect: Mood normal.        Behavior:  Behavior normal.     Wt Readings from Last 3 Encounters:  05/20/22 118 lb (53.5 kg)  11/13/21 116 lb 11.2 oz (52.9 kg)  10/28/21 117 lb 6.4 oz (53.3 kg)    BP 122/82   Pulse 77   Ht _2  (1.6 m)   Wt  118 lb (53.5 kg)   LMP 09/18/2013   SpO2 95%   BMI 20.90 kg/m   Assessment and Plan: 1. Essential hypertension Clinically stable exam with well controlled BP. Tolerating medications without side effects at this time. Pt to continue current regimen and low sodium diet; benefits of regular exercise as able discussed. - bisoprolol-hydrochlorothiazide (ZIAC) 5-6.25 MG tablet; Take 2 tablets by mouth daily.  Dispense: 180 tablet; Refill: 1  2. B12 deficiency Continue monthly self injections - cyanocobalamin (VITAMIN B12) 1000 MCG/ML injection; Inject 1 mL into muscle once every month.  Dispense: 10 mL; Refill: 0 - Syringe, Disposable, 1 ML MISC; For use with B12 injections monthlt  Dispense: 25 each; Refill: 0  3. Bacteria in urine Complete an additional 7 days of bid antibiotics and follow up if symptoms persist - sulfamethoxazole-trimethoprim (BACTRIM DS) 800-160 MG tablet; Take 1 tablet by mouth 2 (two) times daily for 7 days.  Dispense: 14 tablet; Refill: 0  4. Osteoarthritis of both thumbs Recommend topical rubs like IcyHot or BenGay as needed  5. Need for immunization against influenza - Flu Vaccine QUAD 6moIM (Fluarix, Fluzone & Alfiuria Quad PF)   Partially dictated using DEditor, commissioning Any errors are unintentional.  LHalina Maidens MD [email or web address]  05/20/2022

## 2022-08-20 ENCOUNTER — Other Ambulatory Visit: Payer: Self-pay | Admitting: Internal Medicine

## 2022-08-20 DIAGNOSIS — E538 Deficiency of other specified B group vitamins: Secondary | ICD-10-CM

## 2022-09-21 ENCOUNTER — Encounter: Payer: Self-pay | Admitting: Internal Medicine

## 2022-11-05 ENCOUNTER — Ambulatory Visit (INDEPENDENT_AMBULATORY_CARE_PROVIDER_SITE_OTHER): Payer: Medicaid Other | Admitting: Internal Medicine

## 2022-11-05 ENCOUNTER — Encounter: Payer: Self-pay | Admitting: Internal Medicine

## 2022-11-05 VITALS — BP 124/80 | HR 65 | Ht 63.0 in | Wt 117.0 lb

## 2022-11-05 DIAGNOSIS — K219 Gastro-esophageal reflux disease without esophagitis: Secondary | ICD-10-CM | POA: Diagnosis not present

## 2022-11-05 DIAGNOSIS — Z1231 Encounter for screening mammogram for malignant neoplasm of breast: Secondary | ICD-10-CM

## 2022-11-05 DIAGNOSIS — E538 Deficiency of other specified B group vitamins: Secondary | ICD-10-CM

## 2022-11-05 DIAGNOSIS — Z0000 Encounter for general adult medical examination without abnormal findings: Secondary | ICD-10-CM | POA: Diagnosis not present

## 2022-11-05 DIAGNOSIS — I10 Essential (primary) hypertension: Secondary | ICD-10-CM

## 2022-11-05 DIAGNOSIS — K222 Esophageal obstruction: Secondary | ICD-10-CM

## 2022-11-05 DIAGNOSIS — Z86018 Personal history of other benign neoplasm: Secondary | ICD-10-CM

## 2022-11-05 MED ORDER — CYANOCOBALAMIN 1000 MCG/ML IJ SOLN: INTRAMUSCULAR | 3 refills | Status: DC

## 2022-11-05 NOTE — Assessment & Plan Note (Signed)
Clinically stable exam with well controlled BP on bisoprolol hct. Tolerating medications without side effects. Pt to continue current regimen and low sodium diet.

## 2022-11-05 NOTE — Progress Notes (Signed)
Date:  11/05/2022   Name:  Nicole Jacobs   DOB:  [birth year]   MRN:  [id number]   Chief Complaint: Annual Exam Nicole Jacobs is a 57 y.o. female who presents today for her Complete Annual Exam. She feels well. She reports exercising - walking. She reports she is sleeping well. Breast complaints - none.  Mammogram: 09/2021 DEXA: none Pap smear: 10/3021 neg/neg Colonoscopy: 10/3021 repeat 10 yr  Health Maintenance Due  Topic Date Due   HIV Screening  Never done   DTaP/Tdap/Td (1 - Tdap) Never done    Immunization History  Administered Date(s) Administered   Influenza,inj,Quad PF,6+ Mos 09/09/2021, 05/20/2022   PFIZER(Purple Top)SARS-COV-2 Vaccination 11/09/2019, 11/30/2019, 07/31/2020    Hypertension This is a chronic problem. The problem is controlled. Pertinent negatives include no chest pain, headaches, palpitations or shortness of breath. Past treatments include beta blockers and diuretics. The current treatment provides significant improvement. There is no history of kidney disease, CAD/MI or CVA.    Lab Results  Component Value Date   NA 139 10/28/2021   K 4.7 10/28/2021   CO2 22 10/28/2021   GLUCOSE 91 10/28/2021   BUN 9 10/28/2021   CREATININE 0.86 10/28/2021   CALCIUM 10.0 10/28/2021   EGFR 80 10/28/2021   Lab Results  Component Value Date   CHOL 178 10/28/2021   HDL 100 10/28/2021   LDLCALC 65 10/28/2021   TRIG 71 10/28/2021   CHOLHDL 1.8 10/28/2021   Lab Results  Component Value Date   TSH 0.575 10/28/2021   No results found for: "HGBA1C" Lab Results  Component Value Date   WBC 8.7 10/28/2021   HGB 11.8 10/28/2021   HCT 35.8 10/28/2021   MCV 81 10/28/2021   PLT 444 10/28/2021   Lab Results  Component Value Date   ALT 27 10/28/2021   AST 29 10/28/2021   ALKPHOS 90 10/28/2021   BILITOT 0.9 10/28/2021   No results found for: "25OHVITD2", "25OHVITD3", "VD25OH"   Review of Systems  Constitutional:  Negative for chills, fatigue and fever.   HENT:  Negative for congestion, hearing loss, tinnitus, trouble swallowing and voice change.   Eyes:  Negative for visual disturbance.  Respiratory:  Negative for cough, chest tightness, shortness of breath and wheezing.   Cardiovascular:  Negative for chest pain, palpitations and leg swelling.  Gastrointestinal:  Negative for abdominal pain, constipation, diarrhea and vomiting.  Endocrine: Negative for polydipsia and polyuria.  Genitourinary:  Negative for dysuria, frequency, genital sores, vaginal bleeding and vaginal discharge.  Musculoskeletal:  Negative for arthralgias, gait problem and joint swelling.  Skin:  Negative for color change and rash.  Neurological:  Negative for dizziness, tremors, light-headedness and headaches.  Hematological:  Negative for adenopathy. Does not bruise/bleed easily.  Psychiatric/Behavioral:  Negative for dysphoric mood and sleep disturbance. The patient is not nervous/anxious.     Patient Active Problem List   Diagnosis Date Noted   Osteoarthritis of both thumbs 05/20/2022   Colon cancer screening    B12 deficiency 09/09/2021   Hiatal hernia 09/09/2021   GERD with stricture 09/09/2021   Neck pain on right side 01/11/2014   Essential hypertension 03/19/2012    Allergies  Allergen Reactions   Penicillins Rash    Past Surgical History:  Procedure Laterality Date   COLONOSCOPY WITH PROPOFOL N/A 11/13/2021   Procedure: COLONOSCOPY WITH PROPOFOL;  Surgeon: Lin Landsman, MD;  Location: [place];  Service: Endoscopy;  Laterality: N/A;   ESOPHAGOGASTRODUODENOSCOPY  SIGMOIDOSCOPY      Social History   Tobacco Use   Smoking status: Never   Smokeless tobacco: Never  Vaping Use   Vaping Use: Never used  Substance Use Topics   Alcohol use: Yes    Alcohol/week: 1.0 standard drink of alcohol    Types: 1 Standard drinks or equivalent per week    Comment: rare   Drug use: No     Medication list has been reviewed and  updated.  Current Meds  Medication Sig   bisoprolol-hydrochlorothiazide (ZIAC) 5-6.25 MG tablet Take 2 tablets by mouth daily.   INSULIN SYRINGE 1CC/29G 29G X 1/2" 1 ML MISC USE WITH B-12 INJECTIONS   omeprazole (PRILOSEC) 20 MG capsule TAKE 1 CAPSULE(20 MG) BY MOUTH TWICE DAILY BEFORE A MEAL   Syringe, Disposable, 1 ML MISC For use with B12 injections monthlt   [DISCONTINUED] cyanocobalamin (VITAMIN B12) 1000 MCG/ML injection INJECT 1 ML INTO THE MUSCLE EVERY MONTH       11/05/2022   10:48 AM 05/20/2022    3:46 PM 10/28/2021   10:43 AM 09/09/2021    3:16 PM  GAD 7 : Generalized Anxiety Score  Nervous, Anxious, on Edge '1 1 1 '$ 0  Control/stop worrying 0 1 0 0  Worry too much - different things 0 1 1 0  Trouble relaxing 0 1 1 0  Restless 1 0 0 0  Easily annoyed or irritable 1 1 0 0  Afraid - awful might happen 0 0 0 0  Total GAD 7 Score '3 5 3 '$ 0  Anxiety Difficulty Not difficult at all Not difficult at all Not difficult at all Not difficult at all       11/05/2022   10:48 AM 05/20/2022    3:46 PM 10/28/2021   10:43 AM  Depression screen PHQ 2/9  Decreased Interest 0 2 0  Down, Depressed, Hopeless 1 1 0  PHQ - 2 Score 1 3 0  Altered sleeping 1 0 0  Tired, decreased energy 0 1 1  Change in appetite 0 0 0  Feeling bad or failure about yourself  0 0 0  Trouble concentrating 1 0 0  Moving slowly or fidgety/restless 0 0 0  Suicidal thoughts 0 0 0  PHQ-9 Score '3 4 1  '$ Difficult doing work/chores Not difficult at all Not difficult at all Not difficult at all    BP Readings from Last 3 Encounters:  11/05/22 124/80  05/20/22 122/82  11/13/21 (!) 115/55    Physical Exam Vitals and nursing note reviewed.  Constitutional:      General: She is not in acute distress.    Appearance: She is well-developed.  HENT:     Head: Normocephalic and atraumatic.     Right Ear: Tympanic membrane and ear canal normal.     Left Ear: Tympanic membrane and ear canal normal.     Nose:     Right  Sinus: No maxillary sinus tenderness.     Left Sinus: No maxillary sinus tenderness.  Eyes:     General: No scleral icterus.       Right eye: No discharge.        Left eye: No discharge.     Conjunctiva/sclera: Conjunctivae normal.  Neck:     Thyroid: No thyromegaly.     Vascular: No carotid bruit.  Cardiovascular:     Rate and Rhythm: Normal rate and regular rhythm.     Pulses: Normal pulses.     Heart sounds: Normal  heart sounds.  Pulmonary:     Effort: Pulmonary effort is normal. No respiratory distress.     Breath sounds: No wheezing.  Chest:  Breasts:    Right: No mass, nipple discharge, skin change or tenderness.     Left: No mass, nipple discharge, skin change or tenderness.  Abdominal:     General: Bowel sounds are normal.     Palpations: Abdomen is soft.     Tenderness: There is no abdominal tenderness.  Musculoskeletal:     Cervical back: Normal range of motion. No erythema.     Right lower leg: No edema.     Left lower leg: No edema.  Lymphadenopathy:     Cervical: No cervical adenopathy.  Skin:    General: Skin is warm and dry.     Findings: No rash.     Comments: Multiple small freckles and nevi on back and chest w/hx of dysplastic nevus  Neurological:     Mental Status: She is alert and oriented to person, place, and time.     Cranial Nerves: No cranial nerve deficit.     Sensory: No sensory deficit.     Deep Tendon Reflexes: Reflexes are normal and symmetric.  Psychiatric:        Attention and Perception: Attention normal.        Mood and Affect: Mood normal.     Wt Readings from Last 3 Encounters:  11/05/22 117 lb (53.1 kg)  05/20/22 118 lb (53.5 kg)  11/13/21 116 lb 11.2 oz (52.9 kg)    BP 124/80   Pulse 65   Ht '5\' 3"'$  (1.6 m)   Wt 117 lb (53.1 kg)   LMP 09/18/2013   SpO2 99%   BMI 20.73 kg/m   Assessment and Plan:  Problem List Items Addressed This Visit       Cardiovascular and Mediastinum   Essential hypertension (Chronic)     Clinically stable exam with well controlled BP on bisoprolol hct. Tolerating medications without side effects. Pt to continue current regimen and low sodium diet.       Relevant Orders   CBC with Differential/Platelet   Comprehensive metabolic panel   Urinalysis, Routine w reflex microscopic     Digestive   GERD with stricture (Chronic)    Symptoms well controlled on daily PPI No red flag signs such as weight loss, n/v, melena       Relevant Orders   CBC with Differential/Platelet     Other   B12 deficiency (Chronic)    B12 injections at home monthly      Relevant Medications   cyanocobalamin (VITAMIN B12) 1000 MCG/ML injection   Other Relevant Orders   Vitamin B12   Other Visit Diagnoses     Annual physical exam    -  Primary   up to date on screenings and immunizations declines Shingrix schedule mammogram   Relevant Orders   Vitamin B12   CBC with Differential/Platelet   Comprehensive metabolic panel   Lipid panel   TSH   Encounter for screening mammogram for breast cancer       Relevant Orders   MM 3D SCREENING MAMMOGRAM BILATERAL BREAST   Hx of dysplastic nevus       Relevant Orders   Ambulatory referral to Dermatology       Return in about 6 months (around 05/08/2023) for HTN.   Partially dictated using Trent, any errors are not intentional.  Glean Hess, MD [facility] and [place], [place]

## 2022-11-05 NOTE — Assessment & Plan Note (Signed)
B12 injections at home monthly

## 2022-11-05 NOTE — Patient Instructions (Signed)
Call [facility] to schedule your mammogram at [phone].  

## 2022-11-05 NOTE — Assessment & Plan Note (Signed)
Symptoms well controlled on daily PPI No red flag signs such as weight loss, n/v, melena  

## 2022-11-06 LAB — COMPREHENSIVE METABOLIC PANEL
ALT: 14 IU/L (ref 0–32)
AST: 21 IU/L (ref 0–40)
Albumin/Globulin Ratio: 1.4 (ref 1.2–2.2)
Albumin: 4.5 g/dL (ref 3.8–4.9)
Alkaline Phosphatase: 103 IU/L (ref 44–121)
BUN/Creatinine Ratio: 12 (ref 9–23)
BUN: 9 mg/dL (ref 6–24)
Bilirubin Total: 0.4 mg/dL (ref 0.0–1.2)
CO2: 24 mmol/L (ref 20–29)
Calcium: 10.1 mg/dL (ref 8.7–10.2)
Chloride: 100 mmol/L (ref 96–106)
Creatinine, Ser: 0.73 mg/dL (ref 0.57–1.00)
Globulin, Total: 3.3 g/dL (ref 1.5–4.5)
Glucose: 109 mg/dL — ABNORMAL HIGH (ref 70–99)
Potassium: 4.7 mmol/L (ref 3.5–5.2)
Sodium: 140 mmol/L (ref 134–144)
Total Protein: 7.8 g/dL (ref 6.0–8.5)
eGFR: 97 mL/min/{1.73_m2} (ref >59)

## 2022-11-06 LAB — LIPID PANEL
Chol/HDL Ratio: 2 ratio (ref 0.0–4.4)
Cholesterol, Total: 200 mg/dL — ABNORMAL HIGH (ref 100–199)
HDL: 102 mg/dL (ref >39)
LDL Chol Calc (NIH): 86 mg/dL (ref 0–99)
Triglycerides: 68 mg/dL (ref 0–149)
VLDL Cholesterol Cal: 12 mg/dL (ref 5–40)

## 2022-11-06 LAB — MICROSCOPIC EXAMINATION
Casts: NONE SEEN /lpf
WBC, UA: >30 /hpf — AB (ref 0–5)

## 2022-11-06 LAB — CBC WITH DIFFERENTIAL/PLATELET
Basophils Absolute: 0.1 10*3/uL (ref 0.0–0.2)
Basos: 1 %
EOS (ABSOLUTE): 0.1 10*3/uL (ref 0.0–0.4)
Eos: 1 %
Hematocrit: 44.5 % (ref 34.0–46.6)
Hemoglobin: 14.6 g/dL (ref 11.1–15.9)
Immature Grans (Abs): 0 10*3/uL (ref 0.0–0.1)
Immature Granulocytes: 0 %
Lymphocytes Absolute: 2 10*3/uL (ref 0.7–3.1)
Lymphs: 23 %
MCH: 28.9 pg (ref 26.6–33.0)
MCHC: 32.8 g/dL (ref 31.5–35.7)
MCV: 88 fL (ref 79–97)
Monocytes Absolute: 0.9 10*3/uL (ref 0.1–0.9)
Monocytes: 10 %
Neutrophils Absolute: 5.4 10*3/uL (ref 1.4–7.0)
Neutrophils: 65 %
Platelets: 432 10*3/uL (ref 150–450)
RBC: 5.05 x10E6/uL (ref 3.77–5.28)
RDW: 14.5 % (ref 11.7–15.4)
WBC: 8.4 10*3/uL (ref 3.4–10.8)

## 2022-11-06 LAB — URINALYSIS, ROUTINE W REFLEX MICROSCOPIC
Bilirubin, UA: NEGATIVE
Glucose, UA: NEGATIVE
Ketones, UA: NEGATIVE
Nitrite, UA: POSITIVE — AB
Specific Gravity, UA: 1.017 (ref 1.005–1.030)
Urobilinogen, Ur: 0.2 mg/dL (ref 0.2–1.0)
pH, UA: 7 (ref 5.0–7.5)

## 2022-11-06 LAB — VITAMIN B12: Vitamin B-12: 636 pg/mL (ref 232–1245)

## 2022-11-06 LAB — TSH: TSH: 0.951 u[IU]/mL (ref 0.450–4.500)

## 2022-11-07 ENCOUNTER — Other Ambulatory Visit: Payer: Self-pay | Admitting: Internal Medicine

## 2022-11-07 DIAGNOSIS — R319 Hematuria, unspecified: Secondary | ICD-10-CM

## 2022-11-07 MED ORDER — SULFAMETHOXAZOLE-TRIMETHOPRIM 800-160 MG PO TABS: 1.0000 | ORAL_TABLET | Freq: Two times a day (BID) | ORAL | 0 refills | Status: AC

## 2022-11-18 ENCOUNTER — Encounter: Payer: Medicaid Other | Admitting: Internal Medicine

## 2022-12-29 IMAGING — MG MM DIGITAL SCREENING BILAT W/ TOMO AND CAD
6 of 10 series · 6 of 30 positions shown · non-contrast
Comparison: Previous exam(s).

CLINICAL DATA: Screening.

EXAM:
DIGITAL SCREENING BILATERAL MAMMOGRAM WITH TOMOSYNTHESIS AND CAD
TECHNIQUE: Bilateral screening digital craniocaudal and mediolateral oblique
mammograms were obtained. Bilateral screening digital breast
tomosynthesis was performed. The images were evaluated with
computer-aided detection.

[R CC synth-2D]
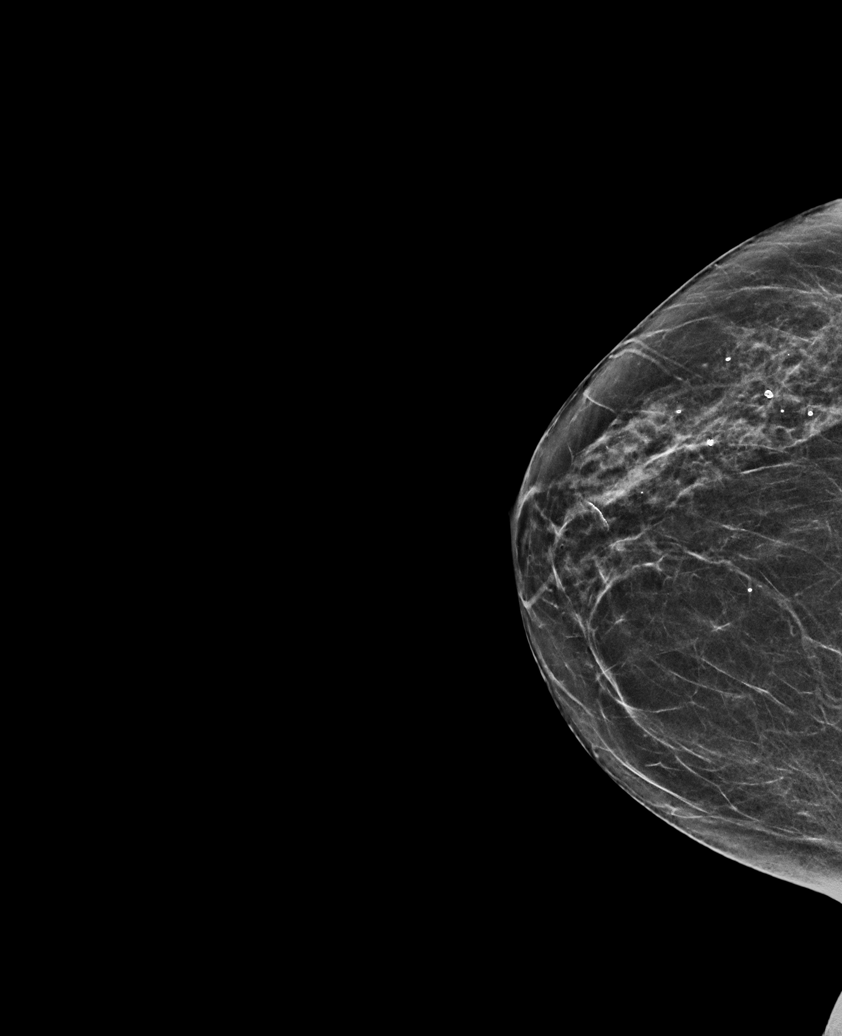

[R MLO synth-2D (1 of 2)]
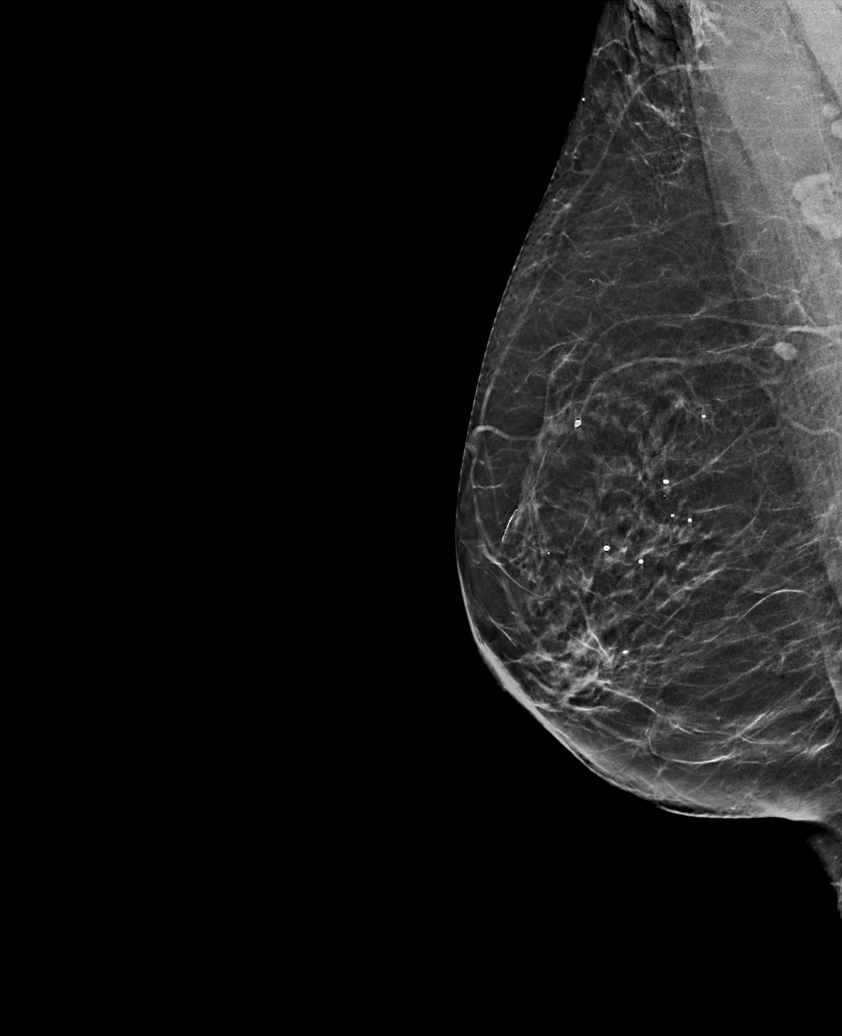

[L CC synth-2D]
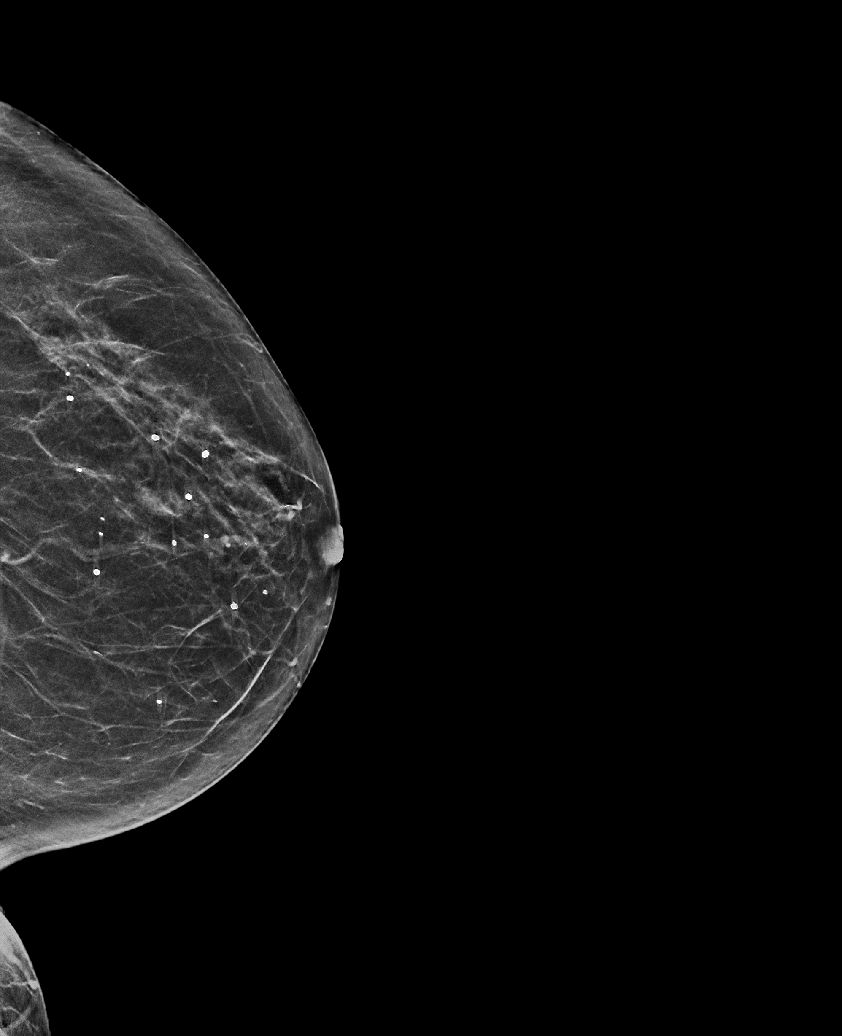

[R MLO synth-2D (2 of 2)]
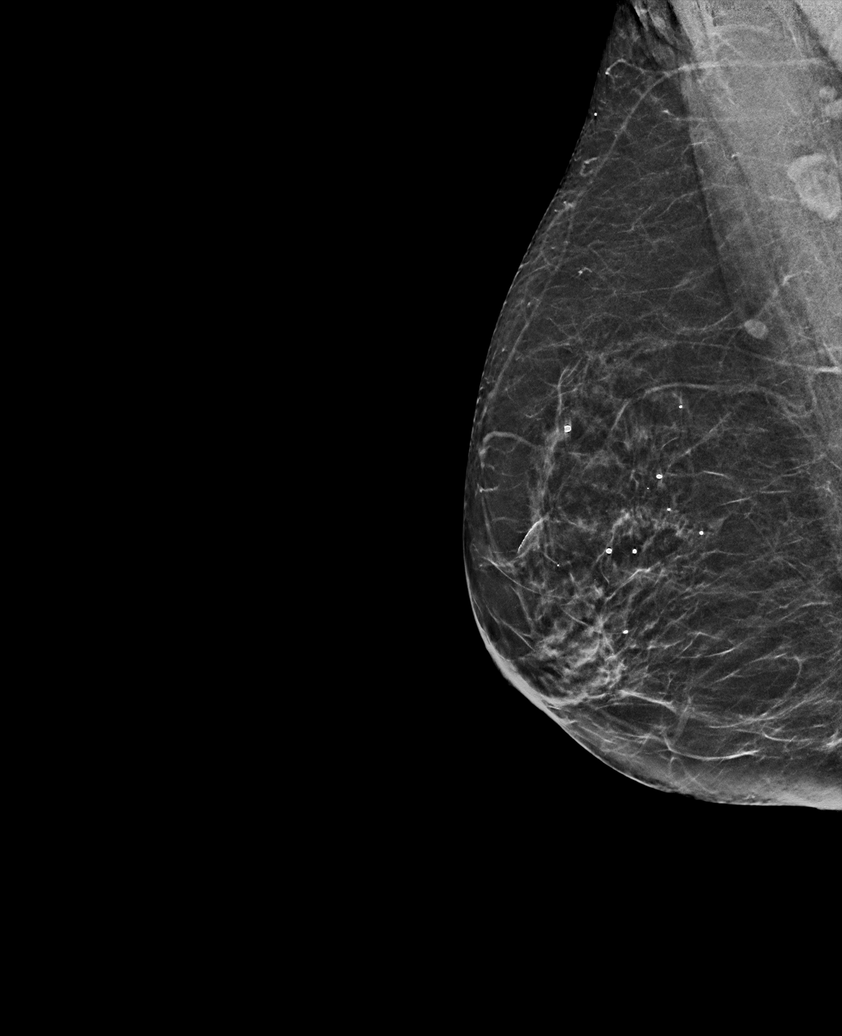

[L MLO synth-2D]
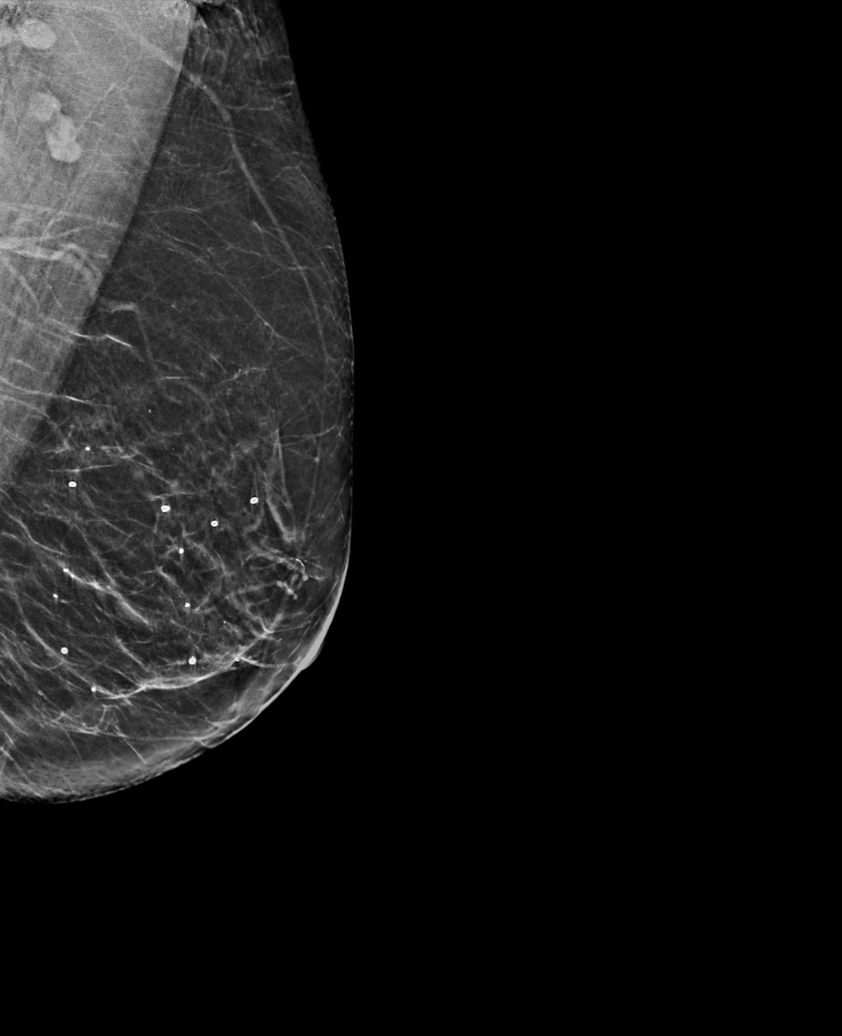

[L MLO tomo · tomo slice 30/59.0]
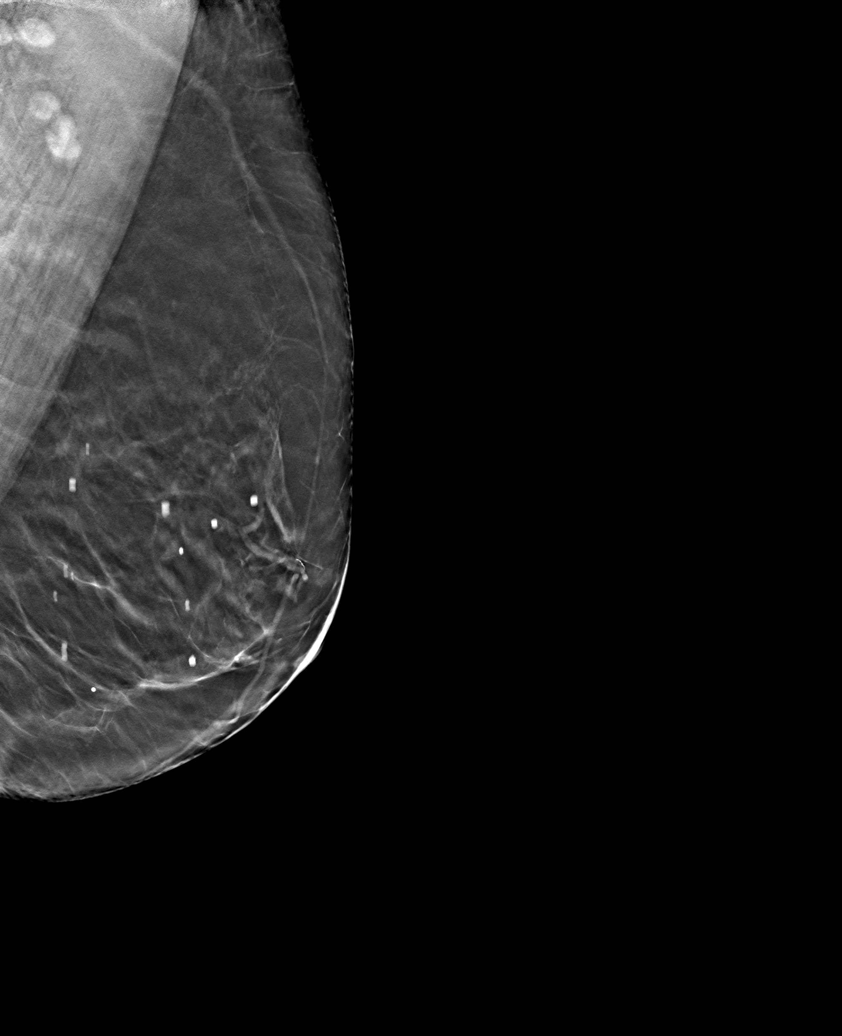

[6 of 30 positions shown; findings below may reference images not displayed]

ACR Breast Density Category b: There are scattered areas of
fibroglandular density.
FINDINGS: There are no findings suspicious for malignancy.
IMPRESSION: No mammographic evidence of malignancy. A result letter of this
screening mammogram will be mailed directly to the patient.

RECOMMENDATION:
Screening mammogram in one year. (Code:51-O-LD2)

BI-RADS CATEGORY  1: Negative.

## 2023-03-26 ENCOUNTER — Other Ambulatory Visit: Payer: Self-pay | Admitting: Internal Medicine

## 2023-03-26 DIAGNOSIS — K219 Gastro-esophageal reflux disease without esophagitis: Secondary | ICD-10-CM

## 2023-05-10 ENCOUNTER — Telehealth: Payer: Self-pay | Admitting: Internal Medicine

## 2023-05-10 ENCOUNTER — Ambulatory Visit: Payer: Medicaid Other | Admitting: Internal Medicine

## 2023-05-10 NOTE — Telephone Encounter (Signed)
Patient missed appointment today, could not leave message.

## 2023-07-31 ENCOUNTER — Other Ambulatory Visit: Payer: Self-pay | Admitting: Internal Medicine

## 2023-07-31 DIAGNOSIS — I10 Essential (primary) hypertension: Secondary | ICD-10-CM

## 2023-08-02 NOTE — Telephone Encounter (Signed)
Requested Prescriptions  Refused Prescriptions Disp Refills   bisoprolol-hydrochlorothiazide (ZIAC) 5-6.25 MG tablet [Pharmacy Med Name: BISOPROLOL/HCTZ 5MG /6.25MG  TABS] 180 tablet 1    Sig: TAKE 2 TABLETS BY MOUTH DAILY     Cardiovascular: Beta Blocker + Diuretic Combos Failed - 07/31/2023  6:23 AM      Failed - K in normal range and within 180 days    Potassium  Date Value Ref Range Status  11/05/2022 4.7 3.5 - 5.2 mmol/L Final         Failed - Na in normal range and within 180 days    Sodium  Date Value Ref Range Status  11/05/2022 140 134 - 144 mmol/L Final         Failed - Cr in normal range and within 180 days    Creatinine, Ser  Date Value Ref Range Status  11/05/2022 0.73 0.57 - 1.00 mg/dL Final         Failed - eGFR in normal range and within 180 days    GFR  Date Value Ref Range Status  05/01/2015 86.37 >60.00 mL/min Final   eGFR  Date Value Ref Range Status  11/05/2022 97 >59 mL/min/1.73 Final         Failed - Valid encounter within last 6 months    Recent Outpatient Visits           9 months ago Annual physical exam   [place] [facility], Nyoka Cowden, MD   1 year ago Essential hypertension   [place] [facility] at [facility], Nyoka Cowden, MD   1 year ago Annual physical exam   [facility] at [facility], Nyoka Cowden, MD   1 year ago B12 deficiency   [facility] at [facility], Nyoka Cowden, MD       Future Appointments             In 3 months Judithann Graves Nyoka Cowden, MD [facility] at [facility]            Passed - Last BP in normal range    BP Readings from Last 1 Encounters:  11/05/22 124/80         Passed - Last Heart Rate in normal range    Pulse Readings from Last 1 Encounters:  11/05/22 65

## 2023-11-09 ENCOUNTER — Encounter: Payer: Self-pay | Admitting: Internal Medicine

## 2023-11-09 NOTE — Assessment & Plan Note (Deleted)
 Reflux symptoms are minimal on current therapy - omeprazole daily. Hx of HH and reflux on EGD No red flag signs such as weight loss, n/v, melena

## 2023-11-09 NOTE — Progress Notes (Deleted)
 Date:  11/09/2023   Name:  Nicole Jacobs   DOB:  01-12-1966   MRN:  [id number]   Chief Complaint: No chief complaint on file. Nicole Jacobs is a 58 y.o. female who presents today for her Complete Annual Exam. She feels {DESC; WELL/FAIRLY WELL/POORLY:18703}. She reports exercising ***. She reports she is sleeping {DESC; WELL/FAIRLY WELL/POORLY:18703}. Breast complaints ***.  Health Maintenance  Topic Date Due   HIV Screening  Never done   DTaP/Tdap/Td vaccine (1 - Tdap) Never done   Zoster (Shingles) Vaccine (1 of 2) Never done   Flu Shot  03/25/2023   COVID-19 Vaccine (4 - 2024-25 season) 04/25/2023   Mammogram  10/15/2023   Pap with HPV screening  10/29/2026   Colon Cancer Screening  11/14/2031   Hepatitis C Screening  Completed   HPV Vaccine  Aged Out      Hypertension This is a chronic problem. The problem is controlled. Pertinent negatives include no chest pain, headaches, palpitations or shortness of breath. Past treatments include beta blockers and diuretics. The current treatment provides significant improvement.    Review of Systems  Constitutional:  Negative for fatigue and unexpected weight change.  HENT:  Negative for trouble swallowing.   Eyes:  Negative for visual disturbance.  Respiratory:  Negative for cough, chest tightness, shortness of breath and wheezing.   Cardiovascular:  Negative for chest pain, palpitations and leg swelling.  Gastrointestinal:  Negative for abdominal pain, constipation and diarrhea.  Musculoskeletal:  Negative for arthralgias and myalgias.  Neurological:  Negative for dizziness, weakness, light-headedness and headaches.     Lab Results  Component Value Date   NA 140 11/05/2022   K 4.7 11/05/2022   CO2 24 11/05/2022   GLUCOSE 109 (H) 11/05/2022   BUN 9 11/05/2022   CREATININE 0.73 11/05/2022   CALCIUM 10.1 11/05/2022   EGFR 97 11/05/2022   Lab Results  Component Value Date   CHOL 200 (H) 11/05/2022   HDL 102  11/05/2022   LDLCALC 86 11/05/2022   TRIG 68 11/05/2022   CHOLHDL 2.0 11/05/2022   Lab Results  Component Value Date   TSH 0.951 11/05/2022   No results found for: "HGBA1C" Lab Results  Component Value Date   WBC 8.4 11/05/2022   HGB 14.6 11/05/2022   HCT 44.5 11/05/2022   MCV 88 11/05/2022   PLT 432 11/05/2022   Lab Results  Component Value Date   ALT 14 11/05/2022   AST 21 11/05/2022   ALKPHOS 103 11/05/2022   BILITOT 0.4 11/05/2022   No results found for: "25OHVITD2", "25OHVITD3", "VD25OH"   Patient Active Problem List   Diagnosis Date Noted   Osteoarthritis of both thumbs 05/20/2022   B12 deficiency 09/09/2021   Hiatal hernia 09/09/2021   GERD with stricture 09/09/2021   Neck pain on right side 01/11/2014   Essential hypertension 03/19/2012    Allergies  Allergen Reactions   Penicillins Rash    Past Surgical History:  Procedure Laterality Date   COLONOSCOPY WITH PROPOFOL N/A 11/13/2021   Procedure: COLONOSCOPY WITH PROPOFOL;  Surgeon: Toney Reil, MD;  Location: [facility];  Service: Endoscopy;  Laterality: N/A;   ESOPHAGOGASTRODUODENOSCOPY     SIGMOIDOSCOPY      Social History   Tobacco Use   Smoking status: Never   Smokeless tobacco: Never  Vaping Use   Vaping status: Never Used  Substance Use Topics   Alcohol use: Yes    Alcohol/week: 1.0 standard drink of  alcohol    Types: 1 Standard drinks or equivalent per week    Comment: rare   Drug use: No     Medication list has been reviewed and updated.  No outpatient medications have been marked as taking for the 11/09/23 encounter (Appointment) with Reubin Milan, MD.       11/05/2022   10:48 AM 05/20/2022    3:46 PM 10/28/2021   10:43 AM 09/09/2021    3:16 PM  GAD 7 : Generalized Anxiety Score  Nervous, Anxious, on Edge 1 1 1  0  Control/stop worrying 0 1 0 0  Worry too much - different things 0 1 1 0  Trouble relaxing 0 1 1 0  Restless 1 0 0 0  Easily annoyed or  irritable 1 1 0 0  Afraid - awful might happen 0 0 0 0  Total GAD 7 Score 3 5 3  0  Anxiety Difficulty Not difficult at all Not difficult at all Not difficult at all Not difficult at all       11/05/2022   10:48 AM 05/20/2022    3:46 PM 10/28/2021   10:43 AM  Depression screen PHQ 2/9  Decreased Interest 0 2 0  Down, Depressed, Hopeless 1 1 0  PHQ - 2 Score 1 3 0  Altered sleeping 1 0 0  Tired, decreased energy 0 1 1  Change in appetite 0 0 0  Feeling bad or failure about yourself  0 0 0  Trouble concentrating 1 0 0  Moving slowly or fidgety/restless 0 0 0  Suicidal thoughts 0 0 0  PHQ-9 Score 3 4 1   Difficult doing work/chores Not difficult at all Not difficult at all Not difficult at all    BP Readings from Last 3 Encounters:  11/05/22 124/80  05/20/22 122/82  11/13/21 (!) 115/55    Physical Exam Vitals and nursing note reviewed.  Constitutional:      General: She is not in acute distress.    Appearance: She is well-developed.  HENT:     Head: Normocephalic and atraumatic.     Right Ear: Tympanic membrane and ear canal normal.     Left Ear: Tympanic membrane and ear canal normal.     Nose:     Right Sinus: No maxillary sinus tenderness.     Left Sinus: No maxillary sinus tenderness.  Eyes:     General: No scleral icterus.       Right eye: No discharge.        Left eye: No discharge.     Conjunctiva/sclera: Conjunctivae normal.  Neck:     Thyroid: No thyromegaly.     Vascular: No carotid bruit.  Cardiovascular:     Rate and Rhythm: Normal rate and regular rhythm.     Pulses: Normal pulses.     Heart sounds: Normal heart sounds.  Pulmonary:     Effort: Pulmonary effort is normal. No respiratory distress.     Breath sounds: No wheezing.  Abdominal:     General: Bowel sounds are normal.     Palpations: Abdomen is soft.     Tenderness: There is no abdominal tenderness.  Musculoskeletal:     Cervical back: Normal range of motion. No erythema.     Right lower  leg: No edema.     Left lower leg: No edema.  Lymphadenopathy:     Cervical: No cervical adenopathy.  Skin:    General: Skin is warm and dry.     Findings:  No rash.  Neurological:     Mental Status: She is alert and oriented to person, place, and time.     Cranial Nerves: No cranial nerve deficit.     Sensory: No sensory deficit.     Deep Tendon Reflexes: Reflexes are normal and symmetric.  Psychiatric:        Attention and Perception: Attention normal.        Mood and Affect: Mood normal.     Wt Readings from Last 3 Encounters:  11/05/22 117 lb (53.1 kg)  05/20/22 118 lb (53.5 kg)  11/13/21 116 lb 11.2 oz (52.9 kg)    LMP 09/18/2013   Assessment and Plan:  Problem List Items Addressed This Visit       Unprioritized   Essential hypertension (Chronic)   B12 deficiency (Chronic)   GERD with stricture (Chronic)   Reflux symptoms are minimal on current therapy - omeprazole daily. Hx of HH and reflux on EGD No red flag signs such as weight loss, n/v, melena       Other Visit Diagnoses       Annual physical exam    -  Primary     Encounter for screening mammogram for breast cancer         Screening for diabetes mellitus         Mild hyperlipidemia           No follow-ups on file.    Reubin Milan, MD [facility] [place]

## 2023-11-18 ENCOUNTER — Inpatient Hospital Stay: Admission: RE | Admit: 2023-11-18 | Source: Ambulatory Visit

## 2023-12-15 ENCOUNTER — Other Ambulatory Visit: Payer: Self-pay | Admitting: Internal Medicine

## 2023-12-15 DIAGNOSIS — Z1231 Encounter for screening mammogram for malignant neoplasm of breast: Secondary | ICD-10-CM

## 2023-12-28 ENCOUNTER — Ambulatory Visit

## 2024-01-05 ENCOUNTER — Ambulatory Visit
Admission: RE | Admit: 2024-01-05 | Discharge: 2024-01-05 | Disposition: A | Discharge status: Home / Self Care | Source: Ambulatory Visit | Attending: Internal Medicine | Admitting: Internal Medicine

## 2024-01-05 DIAGNOSIS — Z1231 Encounter for screening mammogram for malignant neoplasm of breast: Secondary | ICD-10-CM | POA: Insufficient documentation

## 2024-02-01 ENCOUNTER — Encounter: Payer: Self-pay | Admitting: Internal Medicine

## 2024-02-01 ENCOUNTER — Ambulatory Visit (INDEPENDENT_AMBULATORY_CARE_PROVIDER_SITE_OTHER): Admitting: Internal Medicine

## 2024-02-01 ENCOUNTER — Ambulatory Visit
Admission: RE | Admit: 2024-02-01 | Discharge: 2024-02-01 | Disposition: A | Discharge status: Home / Self Care | Source: Ambulatory Visit | Attending: Internal Medicine | Admitting: Internal Medicine

## 2024-02-01 ENCOUNTER — Ambulatory Visit
Admission: RE | Admit: 2024-02-01 | Discharge: 2024-02-01 | Disposition: A | Discharge status: Home / Self Care | Attending: Internal Medicine | Admitting: Internal Medicine

## 2024-02-01 VITALS — BP 134/84 | HR 93 | Ht 63.0 in | Wt 113.1 lb

## 2024-02-01 DIAGNOSIS — K219 Gastro-esophageal reflux disease without esophagitis: Secondary | ICD-10-CM

## 2024-02-01 DIAGNOSIS — M5412 Radiculopathy, cervical region: Secondary | ICD-10-CM | POA: Diagnosis not present

## 2024-02-01 DIAGNOSIS — E538 Deficiency of other specified B group vitamins: Secondary | ICD-10-CM | POA: Diagnosis not present

## 2024-02-01 DIAGNOSIS — K222 Esophageal obstruction: Secondary | ICD-10-CM

## 2024-02-01 DIAGNOSIS — I10 Essential (primary) hypertension: Secondary | ICD-10-CM

## 2024-02-01 MED ORDER — GABAPENTIN 100 MG PO CAPS: 100.0000 mg | ORAL_CAPSULE | Freq: Every day | ORAL | 0 refills | Status: DC

## 2024-02-01 MED ORDER — CYANOCOBALAMIN 1000 MCG/ML IJ SOLN: INTRAMUSCULAR | 0 refills | Status: DC

## 2024-02-01 MED ORDER — BISOPROLOL-HYDROCHLOROTHIAZIDE 5-6.25 MG PO TABS: 2.0000 | ORAL_TABLET | Freq: Every day | ORAL | 0 refills | Status: DC

## 2024-02-01 MED ORDER — CYCLOBENZAPRINE HCL 10 MG PO TABS: 10.0000 mg | ORAL_TABLET | Freq: Three times a day (TID) | ORAL | 0 refills | Status: AC

## 2024-02-01 MED ORDER — OMEPRAZOLE 20 MG PO CPDR: 20.0000 mg | DELAYED_RELEASE_CAPSULE | Freq: Two times a day (BID) | ORAL | 0 refills | Status: DC

## 2024-02-01 MED ORDER — ALBUTEROL SULFATE HFA 108 (90 BASE) MCG/ACT IN AERS
2.0000 | INHALATION_SPRAY | Freq: Four times a day (QID) | RESPIRATORY_TRACT | 0 refills | Status: AC | PRN
Start: 2024-02-01 — End: ?

## 2024-02-01 NOTE — Progress Notes (Signed)
 Date:  02/01/2024   Name:  Nicole Jacobs   DOB:  08-17-1966   MRN:  [id number]   Chief Complaint: Annual Exam and Back Pain (Patient said she is having dull right shoulder/back pain, believes she has a pinched nurse, makes her arm go numb, has been going on 4 - 5 weeks)    Health Maintenance  Topic Date Due   HIV Screening  Never done   DTaP/Tdap/Td vaccine (1 - Tdap) Never done   Zoster (Shingles) Vaccine (1 of 2) Never done   COVID-19 Vaccine (4 - 2024-25 season) 02/16/2025*   Flu Shot  03/24/2024   Mammogram  01/04/2025   Pap with HPV screening  10/29/2026   Colon Cancer Screening  11/14/2031   Hepatitis C Screening  Completed   HPV Vaccine  Aged Out   Meningitis B Vaccine  Aged Out  *Topic was postponed. The date shown is not the original due date.    Neck Pain  This is a new problem. The current episode started more than 1 month ago. The problem occurs constantly. The problem has been unchanged. The pain is associated with nothing. The pain is present in the right side and midline. The quality of the pain is described as burning, aching and cramping. The pain is moderate. Associated symptoms include numbness (in left arm). Pertinent negatives include no pain with swallowing, trouble swallowing or weakness. Treatments tried: topical rubs and pain patches. The treatment provided mild relief.  Hypertension This is a chronic problem. The problem is controlled. Associated symptoms include neck pain.    Review of Systems  Constitutional:  Negative for chills, fatigue and unexpected weight change.  HENT:  Negative for trouble swallowing.   Eyes:  Negative for visual disturbance.  Respiratory:  Negative for chest tightness.   Cardiovascular:  Negative for leg swelling.  Gastrointestinal:  Negative for constipation and diarrhea.  Musculoskeletal:  Positive for neck pain and neck stiffness. Negative for arthralgias and myalgias.  Neurological:  Positive for numbness (in left  arm). Negative for dizziness, weakness and light-headedness.  Psychiatric/Behavioral:  Positive for sleep disturbance.      Lab Results  Component Value Date   NA 140 11/05/2022   K 4.7 11/05/2022   CO2 24 11/05/2022   GLUCOSE 109 (H) 11/05/2022   BUN 9 11/05/2022   CREATININE 0.73 11/05/2022   CALCIUM  10.1 11/05/2022   EGFR 97 11/05/2022   Lab Results  Component Value Date   CHOL 200 (H) 11/05/2022   HDL 102 11/05/2022   LDLCALC 86 11/05/2022   TRIG 68 11/05/2022   CHOLHDL 2.0 11/05/2022   Lab Results  Component Value Date   TSH 0.951 11/05/2022   No results found for: "HGBA1C" Lab Results  Component Value Date   WBC 8.4 11/05/2022   HGB 14.6 11/05/2022   HCT 44.5 11/05/2022   MCV 88 11/05/2022   PLT 432 11/05/2022   Lab Results  Component Value Date   ALT 14 11/05/2022   AST 21 11/05/2022   ALKPHOS 103 11/05/2022   BILITOT 0.4 11/05/2022   No results found for: "25OHVITD2", "25OHVITD3", "VD25OH"   Patient Active Problem List   Diagnosis Date Noted   Osteoarthritis of both thumbs 05/20/2022   B12 deficiency 09/09/2021   Hiatal hernia 09/09/2021   GERD with stricture 09/09/2021   Neck pain on right side 01/11/2014   Essential hypertension 03/19/2012    Allergies  Allergen Reactions   Penicillins Rash    Past  Surgical History:  Procedure Laterality Date   COLONOSCOPY WITH PROPOFOL  N/A 11/13/2021   Procedure: COLONOSCOPY WITH PROPOFOL ;  Surgeon: Selena Daily, MD;  Location: [facility];  Service: Endoscopy;  Laterality: N/A;   ESOPHAGOGASTRODUODENOSCOPY     SIGMOIDOSCOPY      Social History   Tobacco Use   Smoking status: Never   Smokeless tobacco: Never  Vaping Use   Vaping status: Never Used  Substance Use Topics   Alcohol use: Yes    Alcohol/week: 1.0 standard drink of alcohol    Types: 1 Standard drinks or equivalent per week    Comment: rare   Drug use: No     Medication list has been reviewed and updated.  Current  Meds  Medication Sig   albuterol (VENTOLIN HFA) 108 (90 Base) MCG/ACT inhaler Inhale 2 puffs into the lungs every 6 (six) hours as needed for wheezing or shortness of breath.   cyclobenzaprine (FLEXERIL) 10 MG tablet Take 1 tablet (10 mg total) by mouth 3 (three) times daily.   gabapentin (NEURONTIN) 100 MG capsule Take 1 capsule (100 mg total) by mouth at bedtime.   INSULIN SYRINGE 1CC/29G 29G X 1/2" 1 ML MISC USE WITH B-12 INJECTIONS   Syringe, Disposable, 1 ML MISC For use with B12 injections monthlt   [DISCONTINUED] bisoprolol -hydrochlorothiazide  (ZIAC ) 5-6.25 MG tablet Take 2 tablets by mouth daily.   [DISCONTINUED] cyanocobalamin  (VITAMIN B12) 1000 MCG/ML injection INJECT 1 ML INTO THE MUSCLE EVERY MONTH   [DISCONTINUED] omeprazole  (PRILOSEC) 20 MG capsule TAKE 1 CAPSULE(20 MG) BY MOUTH TWICE DAILY BEFORE A MEAL       02/01/2024    3:30 PM 11/05/2022   10:48 AM 05/20/2022    3:46 PM 10/28/2021   10:43 AM  GAD 7 : Generalized Anxiety Score  Nervous, Anxious, on Edge 0 1 1 1   Control/stop worrying 0 0 1 0  Worry too much - different things 1 0 1 1  Trouble relaxing 1 0 1 1  Restless 1 1 0 0  Easily annoyed or irritable 0 1 1 0  Afraid - awful might happen 0 0 0 0  Total GAD 7 Score 3 3 5 3   Anxiety Difficulty Not difficult at all Not difficult at all Not difficult at all Not difficult at all       02/01/2024    3:30 PM 11/05/2022   10:48 AM 05/20/2022    3:46 PM  Depression screen PHQ 2/9  Decreased Interest 0 0 2  Down, Depressed, Hopeless 0 1 1  PHQ - 2 Score 0 1 3  Altered sleeping 0 1 0  Tired, decreased energy 0 0 1  Change in appetite 0 0 0  Feeling bad or failure about yourself  0 0 0  Trouble concentrating 0 1 0  Moving slowly or fidgety/restless 0 0 0  Suicidal thoughts 0 0 0  PHQ-9 Score 0 3 4  Difficult doing work/chores Not difficult at all Not difficult at all Not difficult at all    BP Readings from Last 3 Encounters:  02/01/24 134/84  11/05/22 124/80   05/20/22 122/82    Physical Exam Vitals and nursing note reviewed.  Constitutional:      General: She is in acute distress.     Appearance: She is well-developed.  HENT:     Head: Normocephalic and atraumatic.     Right Ear: Tympanic membrane and ear canal normal.     Left Ear: Tympanic membrane and ear canal  normal.     Nose:     Right Sinus: No maxillary sinus tenderness.     Left Sinus: No maxillary sinus tenderness.  Eyes:     General: No scleral icterus.       Right eye: No discharge.        Left eye: No discharge.     Conjunctiva/sclera: Conjunctivae normal.  Neck:     Thyroid: No thyromegaly.     Vascular: No carotid bruit.  Cardiovascular:     Rate and Rhythm: Normal rate and regular rhythm.     Pulses: Normal pulses.     Heart sounds: Normal heart sounds.  Pulmonary:     Effort: Pulmonary effort is normal. No respiratory distress.     Breath sounds: No wheezing.  Abdominal:     General: Bowel sounds are normal.     Palpations: Abdomen is soft.     Tenderness: There is no abdominal tenderness.  Musculoskeletal:     Right shoulder: Normal.     Left shoulder: Normal.     Cervical back: Normal range of motion. Rigidity, spasms, torticollis and tenderness present. No erythema.     Right lower leg: No edema.     Left lower leg: No edema.  Lymphadenopathy:     Cervical: No cervical adenopathy.  Skin:    General: Skin is warm and dry.     Capillary Refill: Capillary refill takes less than 2 seconds.     Findings: No rash.  Neurological:     General: No focal deficit present.     Mental Status: She is alert and oriented to person, place, and time.     Cranial Nerves: No cranial nerve deficit.     Sensory: Sensory deficit (decreased sensation posterior left arm to hand) present.     Motor: No weakness or tremor.     Coordination: Coordination is intact.     Deep Tendon Reflexes: Reflexes are normal and symmetric.     Reflex Scores:      Brachioradialis reflexes  are 2+ on the right side and 2+ on the left side.      Patellar reflexes are 2+ on the right side and 2+ on the left side. Psychiatric:        Attention and Perception: Attention and perception normal.        Mood and Affect: Mood normal.     Wt Readings from Last 3 Encounters:  02/01/24 113 lb 2 oz (51.3 kg)  11/05/22 117 lb (53.1 kg)  05/20/22 118 lb (53.5 kg)    BP 134/84   Pulse 93   Ht 5\' 3"  (1.6 m)   Wt 113 lb 2 oz (51.3 kg)   LMP 09/18/2013   SpO2 98%   BMI 20.04 kg/m   Assessment and Plan:  Problem List Items Addressed This Visit       Unprioritized   Essential hypertension (Chronic)   Blood pressure is well controlled. High initially due to neck pain but improved on repeat. Current medications are bisoprolol  and hydrochlorothiazide . Will continue same regimen along with efforts to limit dietary sodium.       Relevant Medications   bisoprolol -hydrochlorothiazide  (ZIAC ) 5-6.25 MG tablet   B12 deficiency (Chronic)   Relevant Medications   cyanocobalamin  (VITAMIN B12) 1000 MCG/ML injection   GERD with stricture (Chronic)   Relevant Medications   omeprazole  (PRILOSEC) 20 MG capsule   Other Visit Diagnoses       Cervical radiculopathy at C7    -  Primary   start Flexeril tid and gabapentin qhs continue Voltaren gel and pain patches can use ice or heat; Imaging   Relevant Medications   cyclobenzaprine (FLEXERIL) 10 MG tablet   gabapentin (NEURONTIN) 100 MG capsule   Other Relevant Orders   DG Cervical Spine Complete       No follow-ups on file.    Sheron Dixons, MD [facility] [place]

## 2024-02-01 NOTE — Assessment & Plan Note (Signed)
 Blood pressure is well controlled. High initially due to neck pain but improved on repeat. Current medications are bisoprolol  and hydrochlorothiazide . Will continue same regimen along with efforts to limit dietary sodium.

## 2024-02-07 ENCOUNTER — Ambulatory Visit: Payer: Self-pay | Admitting: Internal Medicine

## 2024-02-21 ENCOUNTER — Ambulatory Visit: Payer: Self-pay

## 2024-02-21 NOTE — Telephone Encounter (Signed)
 FYI Only or Action Required?: Action required by provider: clinical question for provider.  Patient was last seen in primary care on 02/01/2024 by Nicole Leita DEL, MD. Called Nurse Triage reporting Numbness. Symptoms began several weeks ago. Interventions attempted: Prescription medications: Gabapentin . Symptoms are: unchanged.  Triage Disposition: Call PCP Now, See HCP Within 4 Hours (Or PCP Triage)  Patient/caregiver understands and will follow disposition?: No, wishes to speak with PCP  **She would like an update on the neuro referral as well as an increase in the Gabapentin  if advised. **             Copied from CRM [id number]. Topic: Clinical - Red Word Triage >> Feb 21, 2024  3:45 PM Ivette P wrote: Red Word that prompted transfer to Nurse Triage: pt has a pinched nerve, set up for a nuero surgeon. hurting really bad, 8 or 9, having numbness on arm. right. Reason for Disposition  [1] Numbness (i.e., loss of sensation) of the face, arm / hand, or leg / foot on one side of the body AND [2] gradual onset (e.g., days to weeks) AND [3] present now  [1] Caller requests to speak ONLY to PCP AND [2] URGENT question  Answer Assessment - Initial Assessment Questions 1. SYMPTOM: What is the main symptom you are concerned about? (e.g., weakness, numbness)  Numbness, tingling in right arm and hand.       2. ONSET: When did this start? (minutes, hours, days; while sleeping)     X 5-6 weeks   4. PATTERN Does this come and go, or has it been constant since it started?  Is it present now?     Constant    5. CARDIAC SYMPTOMS: Have you had any of the following symptoms: chest pain, difficulty breathing, palpitations?     No   6. NEUROLOGIC SYMPTOMS: Have you had any of the following symptoms: headache, dizziness, vision loss, double vision, changes in speech, unsteady on your feet?     No   7. OTHER SYMPTOMS: Do you have any other symptoms?     No    She is awaiting  the neuro-referral. Gabapentin  is providing some relief, however pain remains. She would like an update on the neuro referral as well as an increase in the Gabapentin  if advised.  Protocols used: Neurologic Deficit-A-AH, PCP Call - No Triage-A-AH

## 2024-02-22 ENCOUNTER — Other Ambulatory Visit: Payer: Self-pay

## 2024-02-22 DIAGNOSIS — M5412 Radiculopathy, cervical region: Secondary | ICD-10-CM

## 2024-02-22 NOTE — Telephone Encounter (Signed)
 Sent pt message and placed urgent referral for neurosurgery.

## 2024-02-24 ENCOUNTER — Telehealth: Payer: Self-pay

## 2024-02-24 ENCOUNTER — Other Ambulatory Visit: Payer: Self-pay

## 2024-02-24 DIAGNOSIS — M5412 Radiculopathy, cervical region: Secondary | ICD-10-CM

## 2024-02-24 MED ORDER — GABAPENTIN 100 MG PO CAPS
100.0000 mg | ORAL_CAPSULE | Freq: Every day | ORAL | 0 refills | Status: DC
Start: 2024-02-24 — End: 2024-05-15

## 2024-02-24 NOTE — Telephone Encounter (Signed)
 Refill sent jm  Copied from CRM [id number]. Topic: Clinical - Prescription Issue >> Feb 23, 2024  4:34 PM Wess RAMAN wrote: Reason for CRM: Patient stated Dr. Justus was suppose to call in more gabapentin  (NEURONTIN ) 100 MG capsule, due to her running out   Callback #: [id number]  Preferred Pharmacy: [id number], [place] - [address] AT [facility] [address] [place] [address] Phone: [phone] Fax: [phone] Hours: Not open 24 hours

## 2024-02-29 NOTE — Progress Notes (Deleted)
 Referring Physician:  Justus Leita DEL, MD [address] [place],  [place] [address]  Primary Physician:  Justus Leita DEL, MD  History of Present Illness: 02/29/2024 Ms. Domnique Vanegas is here today with a chief complaint of ***  Neck pain with left arm numbness  Duration: *** Location: *** Quality: *** Severity: ***  Precipitating: aggravated by *** Modifying factors: made better by *** Weakness: none Timing: *** Bowel/Bladder Dysfunction: none  Conservative measures:  Physical therapy: *** has not participated in physical therapy Multimodal medical therapy including regular antiinflammatories: *** Flexeril , Gabapentin , Voltaren, pain patches OTC Injections: *** no epidural steroid injections  Past Surgery: ***no spinal surgeries.  TABIA LANDOWSKI has ***no symptoms of cervical myelopathy.  The symptoms are causing a significant impact on the patient's life.   Review of Systems:  A 10 point review of systems is negative, except for the pertinent positives and negatives detailed in the HPI.  Past Medical History: Past Medical History:  Diagnosis Date   Anemia    B12 deficiency    Hypertension    Lumbago 12/12/2013   Normal MRI lumbar spine 2012.    Past Surgical History: Past Surgical History:  Procedure Laterality Date   COLONOSCOPY WITH PROPOFOL  N/A 11/13/2021   Procedure: COLONOSCOPY WITH PROPOFOL ;  Surgeon: Unk Corinn Skiff, MD;  Location: [facility];  Service: Endoscopy;  Laterality: N/A;   ESOPHAGOGASTRODUODENOSCOPY     SIGMOIDOSCOPY      Allergies: Allergies as of 03/01/2024 - Review Complete 02/01/2024  Allergen Reaction Noted   Penicillins Rash 02/13/2015    Medications: Outpatient Encounter Medications as of 03/01/2024  Medication Sig   albuterol  (VENTOLIN  HFA) 108 (90 Base) MCG/ACT inhaler Inhale 2 puffs into the lungs every 6 (six) hours as needed for wheezing or shortness of breath.   bisoprolol -hydrochlorothiazide   (ZIAC ) 5-6.25 MG tablet Take 2 tablets by mouth daily.   cyanocobalamin  (VITAMIN B12) 1000 MCG/ML injection INJECT 1 ML INTO THE MUSCLE EVERY MONTH   cyclobenzaprine  (FLEXERIL ) 10 MG tablet Take 1 tablet (10 mg total) by mouth 3 (three) times daily.   gabapentin  (NEURONTIN ) 100 MG capsule Take 1 capsule (100 mg total) by mouth at bedtime.   INSULIN SYRINGE 1CC/29G 29G X 1/2 1 ML MISC USE WITH B-12 INJECTIONS   omeprazole  (PRILOSEC) 20 MG capsule Take 1 capsule (20 mg total) by mouth 2 (two) times daily before a meal.   Syringe, Disposable, 1 ML MISC For use with B12 injections monthlt   No facility-administered encounter medications on file as of 03/01/2024.    Social History: Social History   Tobacco Use   Smoking status: Never   Smokeless tobacco: Never  Vaping Use   Vaping status: Never Used  Substance Use Topics   Alcohol use: Yes    Alcohol/week: 1.0 standard drink of alcohol    Types: 1 Standard drinks or equivalent per week    Comment: rare   Drug use: No    Family Medical History: Family History  Problem Relation Age of Onset   Diabetes Mother    Lung cancer Mother    Hypertension Father    Colon cancer Paternal Grandmother    Breast cancer Neg Hx     Physical Examination: @VITALWITHPAIN @  General: Patient is well developed, well nourished, calm, collected, and in no apparent distress. Attention to examination is appropriate.  Psychiatric: Patient is non-anxious.  Head:  Pupils equal, round, and reactive to light.  ENT:  Oral mucosa appears well hydrated.  Neck:   Supple.  ***Full range of motion.  Respiratory: Patient is breathing without any difficulty.  Extremities: No edema.  Vascular: Palpable dorsal pedal pulses.  Skin:   On exposed skin, there are no abnormal skin lesions.  NEUROLOGICAL:     Awake, alert, oriented to person, place, and time.  Speech is clear and fluent. Fund of knowledge is appropriate.   Cranial Nerves: Pupils equal round  and reactive to light.  Facial tone is symmetric.  Facial sensation is symmetric.  ROM of spine: ***full.  Palpation of spine: ***non tender.    Strength: Side Biceps Triceps Deltoid Interossei Grip Wrist Ext. Wrist Flex.  R 5 5 5 5 5 5 5   L 5 5 5 5 5 5 5    Side Iliopsoas Quads Hamstring PF DF EHL  R 5 5 5 5 5 5   L 5 5 5 5 5 5    Reflexes are ***2+ and symmetric at the biceps, triceps, brachioradialis, patella and achilles.   Hoffman's is absent.  Clonus is not present.  Toes are down-going.  Bilateral upper and lower extremity sensation is intact to light touch.    Gait is normal.   No difficulty with tandem gait.   No evidence of dysmetria noted.  Medical Decision Making  Imaging: ***  I have personally reviewed the images and agree with the above interpretation.  Assessment and Plan: Ms. Oberry is a pleasant 58 y.o. female with ***    Thank you for involving me in the care of this patient.   I spent a total of *** minutes in both face-to-face and non-face-to-face activities for this visit on the date of this encounter.   E[employer]el [employer]s Dept. of Neurosurgery

## 2024-02-29 NOTE — Progress Notes (Addendum)
 Referring Physician:  Justus Leita DEL, MD [address] [place],  [place] [address]  Primary Physician:  Justus Leita DEL, MD  History of Present Illness: 03/01/2024 Nicole Jacobs has a history of HTN, GERD, B12 deficiency.    She has 6-8 month history of constant right side neck pain that radiates down right arm to the fingers. She has constant numbness, weakness, and tingling in her right arm. No left arm pain. She is dropping things with right arm. Pain is worse with using the arm and turning her head to the side. Some relief with biofreeze and lidocaine patches.   Given flexeril  and neurontin  by PCP on 02/01/24- these are not helping. Advised to continue with voltaren gel and pain patches.   She does not smoke.   Bowel/Bladder Dysfunction: none  Conservative measures:  Physical therapy:  has not participated in PT Multimodal medical therapy including regular antiinflammatories:  Flexeril , Gabapentin , Voltaren, pain patches OTC Injections:  no epidural steroid injections  Past Surgery: no spinal surgeries   Nicole Jacobs has no symptoms of cervical myelopathy.  The symptoms are causing a significant impact on the patient's life.   Review of Systems:  A 10 point review of systems is negative, except for the pertinent positives and negatives detailed in the HPI.  Past Medical History: Past Medical History:  Diagnosis Date   Anemia    B12 deficiency    Hypertension    Lumbago 12/12/2013   Normal MRI lumbar spine 2012.    Past Surgical History: Past Surgical History:  Procedure Laterality Date   COLONOSCOPY WITH PROPOFOL  N/A 11/13/2021   Procedure: COLONOSCOPY WITH PROPOFOL ;  Surgeon: Unk Corinn Skiff, MD;  Location: [facility];  Service: Endoscopy;  Laterality: N/A;   ESOPHAGOGASTRODUODENOSCOPY     SIGMOIDOSCOPY      Allergies: Allergies as of 03/01/2024 - Review Complete 03/01/2024  Allergen Reaction Noted   Penicillins Rash  02/13/2015    Medications: Outpatient Encounter Medications as of 03/01/2024  Medication Sig   albuterol  (VENTOLIN  HFA) 108 (90 Base) MCG/ACT inhaler Inhale 2 puffs into the lungs every 6 (six) hours as needed for wheezing or shortness of breath.   bisoprolol -hydrochlorothiazide  (ZIAC ) 5-6.25 MG tablet Take 2 tablets by mouth daily.   cyanocobalamin  (VITAMIN B12) 1000 MCG/ML injection INJECT 1 ML INTO THE MUSCLE EVERY MONTH   cyclobenzaprine  (FLEXERIL ) 10 MG tablet Take 1 tablet (10 mg total) by mouth 3 (three) times daily.   gabapentin  (NEURONTIN ) 100 MG capsule Take 1 capsule (100 mg total) by mouth at bedtime.   INSULIN SYRINGE 1CC/29G 29G X 1/2 1 ML MISC USE WITH B-12 INJECTIONS   methylPREDNISolone  (MEDROL  DOSEPAK) 4 MG TBPK tablet Use as directed x 6 days.   omeprazole  (PRILOSEC) 20 MG capsule Take 1 capsule (20 mg total) by mouth 2 (two) times daily before a meal.   Syringe, Disposable, 1 ML MISC For use with B12 injections monthlt   No facility-administered encounter medications on file as of 03/01/2024.    Social History: Social History   Tobacco Use   Smoking status: Never   Smokeless tobacco: Never  Vaping Use   Vaping status: Never Used  Substance Use Topics   Alcohol use: Yes    Alcohol/week: 1.0 standard drink of alcohol    Types: 1 Standard drinks or equivalent per week    Comment: rare   Drug use: No    Family Medical History: Family History  Problem Relation Age of Onset  Diabetes Mother    Lung cancer Mother    Hypertension Father    Colon cancer Paternal Grandmother    Breast cancer Neg Hx     Physical Examination: Vitals:   03/01/24 1007 03/01/24 1035  BP: (!) 158/96 (!) 160/100    General: Patient is well developed, well nourished, calm, collected, and in no apparent distress. Attention to examination is appropriate.  Respiratory: Patient is breathing without any difficulty.   NEUROLOGICAL:     Awake, alert, oriented to person, place, and  time.  Speech is clear and fluent. Fund of knowledge is appropriate.   Cranial Nerves: Pupils equal round and reactive to light.  Facial tone is symmetric.    No posterior cervical tenderness. Mild tenderness in right trapezial region.   No abnormal lesions on exposed skin.   Strength: Side Biceps Triceps Deltoid Interossei Grip Wrist Ext. Wrist Flex.  R 5 5 5 5 5 5 5   L 5 5 5 5 5 5 5    Side Iliopsoas Quads Hamstring PF DF EHL  R 5 5 5 5 5 5   L 5 5 5 5 5 5    Reflexes are 2+ and symmetric at the biceps, brachioradialis, patella and achilles.   Hoffman's is absent.  Clonus is not present.   Bilateral upper and lower extremity sensation is intact to light touch.     Good ROM of both shoulders with no pain.   Gait is normal.    Medical Decision Making  Imaging: Cervical xrays dated 02/01/24:  FINDINGS: Vertebral body alignment is normal 4 minimal stair step subluxations compatible with degenerative changes. Vertebral body heights are normal. There is mild to moderate spondylosis of the mid to lower cervical spine. Moderate disc space narrowing from the C5-6 level to the C7-T1 level. There is moderate uncovertebral joint spurring and facet arthropathy. There is moderate left-sided neural foraminal narrowing at the C3-4 and C4-5 levels and to lesser extent the C5-6 level. There is mild right-sided neural foraminal narrowing at the C4-5 and C5-6 levels. Atlantoaxial articulation is unremarkable. Prevertebral soft tissues are normal.   IMPRESSION: 1. No acute findings. 2. Mild to moderate spondylosis of the mid to lower cervical spine with moderate disc disease from the C5-6 level to the C7-T1 level. 3. Moderate left-sided neural foraminal narrowing at the C3-4 and C4-5 levels and to lesser extent the C5-6 level. Mild right-sided neural foraminal narrowing at the C4-5 and C5-6 levels.     Electronically Signed   By: Toribio Agreste M.D.   On: 02/07/2024 15:25    I have  personally reviewed the images and agree with the above interpretation. Looks like she has slight slip at C4-C5.   Assessment and Plan: [name] has a 6-8 month history of constant right side neck pain that radiates down right arm to the fingers. She has constant numbness, weakness, and tingling in her right arm. No left arm pain. She is dropping things with right arm.   She has slight slip at C4-C5 with cervical spondylosis and DDD C5-T1.   Treatment options discussed with patient and following plan made:   - Cervical MRI ordered to further evaluate neck and right arm pain.  - Will get cervical xrays with flex/ext when she gets MRI.  - Medrol  dose pack to help with symptom relief. Reviewed dosing and side effects.  - Continue on neurontin  and flexeril  from PCP- can take over prescribing neurontin  if needed.  - Will follow up visit to review  MRI results once I get them back.   BP was elevated. No symptoms of chest pain, shortness of breath, blurry vision, or headaches. She will recheck at home and call PCP if not improved. If she develops CP, SOB, blurry vision, or headaches, then she will go to ED. She states PCP is aware that it has been running higher.   I spent a total of 35 minutes in face-to-face and non-face-to-face activities related to this patient's care today including review of outside records, review of imaging, review of symptoms, physical exam, discussion of differential diagnosis, discussion of treatment options, and documentation.   Thank you for involving me in the care of this patient.   Glade Boys PA-C Dept. of Neurosurgery

## 2024-03-01 ENCOUNTER — Encounter: Payer: Self-pay | Admitting: Orthopedic Surgery

## 2024-03-01 ENCOUNTER — Ambulatory Visit: Admitting: Orthopedic Surgery

## 2024-03-01 ENCOUNTER — Ambulatory Visit: Admitting: Neurosurgery

## 2024-03-01 VITALS — BP 160/100 | Ht 63.0 in | Wt 115.0 lb

## 2024-03-01 DIAGNOSIS — M5412 Radiculopathy, cervical region: Secondary | ICD-10-CM

## 2024-03-01 DIAGNOSIS — M47812 Spondylosis without myelopathy or radiculopathy, cervical region: Secondary | ICD-10-CM

## 2024-03-01 DIAGNOSIS — M5033 Other cervical disc degeneration, cervicothoracic region: Secondary | ICD-10-CM | POA: Diagnosis not present

## 2024-03-01 DIAGNOSIS — M5021 Other cervical disc displacement,  high cervical region: Secondary | ICD-10-CM | POA: Diagnosis not present

## 2024-03-01 DIAGNOSIS — M47813 Spondylosis without myelopathy or radiculopathy, cervicothoracic region: Secondary | ICD-10-CM

## 2024-03-01 DIAGNOSIS — M4312 Spondylolisthesis, cervical region: Secondary | ICD-10-CM

## 2024-03-01 DIAGNOSIS — M5030 Other cervical disc degeneration, unspecified cervical region: Secondary | ICD-10-CM

## 2024-03-01 MED ORDER — METHYLPREDNISOLONE 4 MG PO TBPK
ORAL_TABLET | ORAL | 0 refills | Status: AC
Start: 2024-03-01 — End: ?

## 2024-03-01 NOTE — Patient Instructions (Signed)
 It was so nice to see you today. Thank you so much for coming in.    You have some wear and tear in your neck (arthritis) and I think this may be causing your pain.   I want to get an MRI of your neck to look into things further. We will get this approved through your insurance and [facility] will call you to schedule the appointment. Ask about your patient responsibility. You do not need to pay this prior to getting MRI, they can bill you.   Remind them to do xrays of your neck when you get the MRI done.   After you have the MRI, it takes 14-28 days for me to get the results back. If I don't have them after 2 weeks, we will call to try to get them read.   Once I have the results, we will call you to schedule a follow up visit with me to review them.   I sent a prescription for a steroid dose pack to help with pain and inflammation. Take as directed.   Okay to continue on flexeril  and neurontin  as prescribed by your PCP.   Your blood pressure was elevated today. I want you to recheck it at home and follow up with your PCP if it remains high. If you have any chest pain, shortness of breath, blurry vision, or headaches then you need to go to ED.    Please do not hesitate to call if you have any questions or concerns. You can also message me in MyChart.   Glade Boys PA-C [phone]     The physicians and staff at [facility] at [facility] are committed to providing excellent care. You may receive a survey asking for feedback about your experience at our office. We value you your feedback and appreciate you taking the time to to fill it out. The [facility] leadership team is also available to discuss your experience in person, feel free to contact us  [phone].

## 2024-03-16 ENCOUNTER — Other Ambulatory Visit: Payer: Self-pay

## 2024-03-16 DIAGNOSIS — M4312 Spondylolisthesis, cervical region: Secondary | ICD-10-CM

## 2024-03-16 DIAGNOSIS — M47812 Spondylosis without myelopathy or radiculopathy, cervical region: Secondary | ICD-10-CM

## 2024-03-16 DIAGNOSIS — M5412 Radiculopathy, cervical region: Secondary | ICD-10-CM

## 2024-03-16 DIAGNOSIS — M5030 Other cervical disc degeneration, unspecified cervical region: Secondary | ICD-10-CM

## 2024-03-16 NOTE — Progress Notes (Signed)
 PT order has been placed due to insurance denying her MRI.

## 2024-05-11 NOTE — Progress Notes (Deleted)
 Referring Physician:  Justus Leita DEL, MD [address] [place],  [place] [address]  Primary Physician:  Justus Leita DEL, MD  History of Present Illness: 05/11/2024 [name] has a history of HTN, GERD, B12 deficiency.    Last seen by me on 03/01/24 for neck and right arm pain. She has slight slip at C4-C5 with cervical spondylosis and DDD C5-T1.   Cervical MRI and flex/ext xrays ordered and denied by insurance as she had not done PT. PT was ordered (she did not go?***).   She is here for follow up.         She has 6-8 month history of constant right side neck pain that radiates down right arm to the fingers. She has constant numbness, weakness, and tingling in her right arm. No left arm pain. She is dropping things with right arm. Pain is worse with using the arm and turning her head to the side. Some relief with biofreeze and lidocaine patches.   Given flexeril  and neurontin  by PCP on 02/01/24- these are not helping. Advised to continue with voltaren gel and pain patches.   She does not smoke.   Bowel/Bladder Dysfunction: none  Conservative measures:  Physical therapy:  has not participated in PT Multimodal medical therapy including regular antiinflammatories:  Flexeril , Gabapentin , Voltaren, pain patches OTC Injections:  no epidural steroid injections  Past Surgery: no spinal surgeries   Nicole Jacobs has no symptoms of cervical myelopathy.  The symptoms are causing a significant impact on the patient's life.   Review of Systems:  A 10 point review of systems is negative, except for the pertinent positives and negatives detailed in the HPI.  Past Medical History: Past Medical History:  Diagnosis Date   Anemia    B12 deficiency    Hypertension    Lumbago 12/12/2013   Normal MRI lumbar spine 2012.    Past Surgical History: Past Surgical History:  Procedure Laterality Date   COLONOSCOPY WITH PROPOFOL  N/A 11/13/2021   Procedure:  COLONOSCOPY WITH PROPOFOL ;  Surgeon: Unk Corinn Skiff, MD;  Location: [facility];  Service: Endoscopy;  Laterality: N/A;   ESOPHAGOGASTRODUODENOSCOPY     SIGMOIDOSCOPY      Allergies: Allergies as of 05/15/2024 - Review Complete 03/01/2024  Allergen Reaction Noted   Penicillins Rash 02/13/2015    Medications: Outpatient Encounter Medications as of 05/15/2024  Medication Sig   albuterol  (VENTOLIN  HFA) 108 (90 Base) MCG/ACT inhaler Inhale 2 puffs into the lungs every 6 (six) hours as needed for wheezing or shortness of breath.   bisoprolol -hydrochlorothiazide  (ZIAC ) 5-6.25 MG tablet Take 2 tablets by mouth daily.   cyanocobalamin  (VITAMIN B12) 1000 MCG/ML injection INJECT 1 ML INTO THE MUSCLE EVERY MONTH   cyclobenzaprine  (FLEXERIL ) 10 MG tablet Take 1 tablet (10 mg total) by mouth 3 (three) times daily.   gabapentin  (NEURONTIN ) 100 MG capsule Take 1 capsule (100 mg total) by mouth at bedtime.   INSULIN SYRINGE 1CC/29G 29G X 1/2 1 ML MISC USE WITH B-12 INJECTIONS   methylPREDNISolone  (MEDROL  DOSEPAK) 4 MG TBPK tablet Use as directed x 6 days.   omeprazole  (PRILOSEC) 20 MG capsule Take 1 capsule (20 mg total) by mouth 2 (two) times daily before a meal.   Syringe, Disposable, 1 ML MISC For use with B12 injections monthlt   No facility-administered encounter medications on file as of 05/15/2024.    Social History: Social History   Tobacco Use   Smoking status: Never  Smokeless tobacco: Never  Vaping Use   Vaping status: Never Used  Substance Use Topics   Alcohol use: Yes    Alcohol/week: 1.0 standard drink of alcohol    Types: 1 Standard drinks or equivalent per week    Comment: rare   Drug use: No    Family Medical History: Family History  Problem Relation Age of Onset   Diabetes Mother    Lung cancer Mother    Hypertension Father    Colon cancer Paternal Grandmother    Breast cancer Neg Hx     Physical Examination: There were no vitals filed for this  visit.    Awake, alert, oriented to person, place, and time.  Speech is clear and fluent. Fund of knowledge is appropriate.   Cranial Nerves: Pupils equal round and reactive to light.  Facial tone is symmetric.    No posterior cervical tenderness. Mild tenderness in right trapezial region.   No abnormal lesions on exposed skin.   Strength: Side Biceps Triceps Deltoid Interossei Grip Wrist Ext. Wrist Flex.  R 5 5 5 5 5 5 5   L 5 5 5 5 5 5 5    Side Iliopsoas Quads Hamstring PF DF EHL  R 5 5 5 5 5 5   L 5 5 5 5 5 5    Reflexes are 2+ and symmetric at the biceps, brachioradialis, patella and achilles.   Hoffman's is absent.  Clonus is not present.   Bilateral upper and lower extremity sensation is intact to light touch.     Good ROM of both shoulders with no pain.   Gait is normal.    Medical Decision Making  Imaging: None  Assessment and Plan: Nicole Jacobs has a 6-8 month history of constant right side neck pain that radiates down right arm to the fingers. She has constant numbness, weakness, and tingling in her right arm. No left arm pain. She is dropping things with right arm.   She has slight slip at C4-C5 with cervical spondylosis and DDD C5-T1.   Treatment options discussed with patient and following plan made:   - Cervical MRI ordered to further evaluate neck and right arm pain.  - Will get cervical xrays with flex/ext when she gets MRI.  - Medrol  dose pack to help with symptom relief. Reviewed dosing and side effects.  - Continue on neurontin  and flexeril  from PCP- can take over prescribing neurontin  if needed.  - Will follow up visit to review MRI results once I get them back.   BP was elevated. No symptoms of chest pain, shortness of breath, blurry vision, or headaches. She will recheck at home and call PCP if not improved. If she develops CP, SOB, blurry vision, or headaches, then she will go to ED. She states PCP is aware that it has been running higher.   I spent a  total of 35 minutes in face-to-face and non-face-to-face activities related to this patient's care today including review of outside records, review of imaging, review of symptoms, physical exam, discussion of differential diagnosis, discussion of treatment options, and documentation.   Thank you for involving me in the care of this patient.   Glade Boys PA-C Dept. of Neurosurgery

## 2024-05-13 ENCOUNTER — Other Ambulatory Visit: Payer: Self-pay | Admitting: Internal Medicine

## 2024-05-13 DIAGNOSIS — K219 Gastro-esophageal reflux disease without esophagitis: Secondary | ICD-10-CM

## 2024-05-13 DIAGNOSIS — M5412 Radiculopathy, cervical region: Secondary | ICD-10-CM

## 2024-05-13 DIAGNOSIS — I10 Essential (primary) hypertension: Secondary | ICD-10-CM

## 2024-05-15 ENCOUNTER — Ambulatory Visit: Admitting: Orthopedic Surgery

## 2024-05-15 NOTE — Telephone Encounter (Signed)
 Requested Prescriptions  Pending Prescriptions Disp Refills   bisoprolol -hydrochlorothiazide  (ZIAC ) 5-6.25 MG tablet [Pharmacy Med Name: BISOPROLOL /HCTZ 5MG /6.25MG  TABS] 180 tablet 3    Sig: TAKE 2 TABLETS BY MOUTH DAILY     Cardiovascular: Beta Blocker + Diuretic Combos Failed - 05/15/2024 12:43 PM      Failed - K in normal range and within 180 days    Potassium  Date Value Ref Range Status  11/05/2022 4.7 3.5 - 5.2 mmol/L Final         Failed - Na in normal range and within 180 days    Sodium  Date Value Ref Range Status  11/05/2022 140 134 - 144 mmol/L Final         Failed - Cr in normal range and within 180 days    Creatinine, Ser  Date Value Ref Range Status  11/05/2022 0.73 0.57 - 1.00 mg/dL Final         Failed - eGFR in normal range and within 180 days    GFR  Date Value Ref Range Status  05/01/2015 86.37 >60.00 mL/min Final   eGFR  Date Value Ref Range Status  11/05/2022 97 >59 mL/min/1.73 Final         Failed - Last BP in normal range    BP Readings from Last 1 Encounters:  03/01/24 (!) 160/100         Passed - Last Heart Rate in normal range    Pulse Readings from Last 1 Encounters:  02/01/24 93         Passed - Valid encounter within last 6 months    Recent Outpatient Visits           3 months ago Cervical radiculopathy at C7   [facility] at [facility], Leita DEL, MD       Future Appointments             In 8 months Lemon Raisin, MD [facility] Primary Care & Sports Medicine at [facility], [phone]             omeprazole  (PRILOSEC) 20 MG capsule [Pharmacy Med Name: OMEPRAZOLE  20MG  CAPSULES] 180 capsule 3    Sig: TAKE 1 CAPSULE(20 MG) BY MOUTH TWICE DAILY BEFORE A MEAL     Gastroenterology: Proton Pump Inhibitors Passed - 05/15/2024 12:43 PM      Passed - Valid encounter within last 12 months    Recent Outpatient Visits           3 months ago Cervical radiculopathy at C7   [facility] Medicine at [facility], Leita DEL, MD       Future Appointments             In 8 months Lemon Raisin, MD [facility] Primary Care & Sports Medicine at [facility], [phone] [address]             gabapentin  (NEURONTIN ) 100 MG capsule [Pharmacy Med Name: GABAPENTIN  100MG  CAPSULES] 90 capsule 3    Sig: TAKE 1 CAPSULE(100 MG) BY MOUTH AT BEDTIME     Neurology: Anticonvulsants - gabapentin  Failed - 05/15/2024 12:43 PM      Failed - Cr in normal range and within 360 days    Creatinine, Ser  Date Value Ref Range Status  11/05/2022 0.73 0.57 - 1.00 mg/dL Final         Passed - Completed PHQ-2 or PHQ-9 in the last 360  days      Passed - Valid encounter within last 12 months    Recent Outpatient Visits           3 months ago Cervical radiculopathy at C7   [facility] Primary Care & Sports Medicine at [facility], Leita DEL, MD       Future Appointments             In 8 months Lemon Raisin, MD [facility] Primary Care & Sports Medicine at [facility], [phone]

## 2024-06-01 ENCOUNTER — Encounter: Payer: Self-pay | Admitting: Orthopedic Surgery

## 2024-06-08 ENCOUNTER — Other Ambulatory Visit: Payer: Self-pay | Admitting: Internal Medicine

## 2024-06-08 DIAGNOSIS — E538 Deficiency of other specified B group vitamins: Secondary | ICD-10-CM

## 2024-06-12 NOTE — Telephone Encounter (Signed)
 Requested medication (s) are due for refill today: yes  Requested medication (s) are on the active medication list: yes  Last refill:  02/01/24  Future visit scheduled: yes  Notes to clinic:  Unable to refill per protocol due to failed labs, no updated results.      Requested Prescriptions  Pending Prescriptions Disp Refills   cyanocobalamin  (VITAMIN B12) 1000 MCG/ML injection [Pharmacy Med Name: CYANOCOBALAMIN  1000MCG/ML INJ, 1ML] 10 mL 0    Sig: INJECT 1 ML INTO THE MUSCLE EVERY MONTH     Endocrinology:  Vitamins - Vitamin B12 Failed - 06/12/2024  9:10 AM      Failed - HCT in normal range and within 360 days    Hematocrit  Date Value Ref Range Status  11/05/2022 44.5 34.0 - 46.6 % Final         Failed - HGB in normal range and within 360 days    Hemoglobin  Date Value Ref Range Status  11/05/2022 14.6 11.1 - 15.9 g/dL Final         Failed - B12 Level in normal range and within 360 days    Vitamin B-12  Date Value Ref Range Status  11/05/2022 636 232 - 1,245 pg/mL Final         Passed - Valid encounter within last 12 months    Recent Outpatient Visits           4 months ago Cervical radiculopathy at C7   [facility] at [facility], Leita DEL, MD       Future Appointments             In 7 months Lemon Raisin, MD [facility] at [facility], [phone] [place]

## 2025-02-02 ENCOUNTER — Encounter: Admitting: Student
